# Patient Record
Sex: Male | Born: 1985 | Race: White | Hispanic: No | Marital: Single | Smoking: Never smoker
Health system: Southern US, Community
[De-identification: ages and names within clinical notes are randomized; demographics above are authoritative.]

## PROBLEM LIST (undated history)

## (undated) DIAGNOSIS — M109 Gout, unspecified: Secondary | ICD-10-CM

## (undated) DIAGNOSIS — T7840XA Allergy, unspecified, initial encounter: Secondary | ICD-10-CM

## (undated) DIAGNOSIS — E785 Hyperlipidemia, unspecified: Secondary | ICD-10-CM

## (undated) DIAGNOSIS — Z880 Allergy status to penicillin: Secondary | ICD-10-CM

## (undated) DIAGNOSIS — Z882 Allergy status to sulfonamides status: Secondary | ICD-10-CM

## (undated) HISTORY — DX: Gout, unspecified: M10.9

## (undated) HISTORY — DX: Allergy, unspecified, initial encounter: T78.40XA

## (undated) HISTORY — DX: Hyperlipidemia, unspecified: E78.5

## (undated) HISTORY — PX: COSMETIC SURGERY: SHX468

## (undated) HISTORY — PX: KNEE SURGERY: SHX244

---
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
## 2006-05-24 ENCOUNTER — Emergency Department (HOSPITAL_COMMUNITY): Admission: EM | Admit: 2006-05-24 | Discharge: 2006-05-24 | Payer: Self-pay | Admitting: Emergency Medicine

## 2010-02-09 ENCOUNTER — Emergency Department (HOSPITAL_COMMUNITY): Admission: EM | Admit: 2010-02-09 | Discharge: 2010-02-09 | Payer: Self-pay | Admitting: Family Medicine

## 2011-01-06 LAB — POCT I-STAT, CHEM 8
BUN: 14 mg/dL (ref 6–23)
Calcium, Ion: 1.16 mmol/L (ref 1.12–1.32)
Chloride: 105 meq/L (ref 96–112)
Creatinine, Ser: 0.9 mg/dL (ref 0.4–1.5)
Glucose, Bld: 96 mg/dL (ref 70–99)
HCT: 55 % — ABNORMAL HIGH (ref 39.0–52.0)
Hemoglobin: 18.7 g/dL — ABNORMAL HIGH (ref 13.0–17.0)
Potassium: 4.2 meq/L (ref 3.5–5.1)
Sodium: 139 meq/L (ref 135–145)
TCO2: 25 mmol/L (ref 0–100)

## 2013-12-24 ENCOUNTER — Ambulatory Visit: Payer: 59

## 2013-12-24 ENCOUNTER — Ambulatory Visit (INDEPENDENT_AMBULATORY_CARE_PROVIDER_SITE_OTHER): Payer: 59 | Admitting: Internal Medicine

## 2013-12-24 VITALS — BP 114/66 | HR 103 | Temp 98.5°F | Resp 16 | Ht 68.0 in | Wt 252.0 lb

## 2013-12-24 DIAGNOSIS — M79609 Pain in unspecified limb: Secondary | ICD-10-CM

## 2013-12-24 DIAGNOSIS — M79671 Pain in right foot: Secondary | ICD-10-CM

## 2013-12-24 MED ORDER — MELOXICAM 15 MG PO TABS: 15.0000 mg | ORAL_TABLET | Freq: Every day | ORAL | Status: DC

## 2013-12-24 NOTE — Progress Notes (Addendum)
   Subjective:    Patient ID: Scott Payne, male    DOB: 04/22/1986, 28 y.o.   MRN: [id number] This chart was scribed for Ellamae Siaobert Karlton Maya, MD by Nicholos Johnsenise Iheanachor, Medical Scribe. This patient's care was started at 3:55 PM.  Foot Pain   HPI Comments: Scott Payne is a 28 y.o. male who presents to the [facility] complaining of right foot pain, onset 2 days ago. Says no pain with pressure, pain upon picking the foot up. Pt states he was building a trailer all day 2 days ago and was wearing thick boots. Noticed some mild pain that night but thought nothing of it. Says he woke up the next day and could barely walk on the foot. Pain subsided minimally yesterday but pt says he just wants to make sure everything is okay. Unaware of a hx of gout. Mother states he had an incident years ago with a foot that resulted in similar symptoms as those present today but she cannot remember which foot; does remember pt had to be placed in a boot.  Review of Systems Noncontributory Objective:  Physical Exam  Nursing note and vitals reviewed. Constitutional: He is oriented to person, place, and time. He appears well-developed and well-nourished. No distress.  HENT:  Head: Normocephalic.  Cardiovascular: Normal rate.   Pulmonary/Chest: Effort normal.  Musculoskeletal:  RIGHT FOOT: With redness dorsally but no cellulitis Tender to palpation over the fourth metatarsal and third metatarsal but has good range of motion without pain Ankle is intact  Neurological: He is alert and oriented to person, place, and time.  Skin: Skin is warm and dry.  Psychiatric: He has a normal mood and affect. His behavior is normal.   [facility] reading (PRIMARY) by  Dr. Savi Lastinger=NAD   Assessment & Plan:  I have completed the patient encounter in its entirety as documented by the scribe, with editing by me where necessary. Tracie Dore P. Merla Richesoolittle, M.D.  Pain foot secondary to tendinitis following constriction  of foot by boot Plan-nonweightbearing for 2-3 days Meloxicam Followup if not well

## 2015-08-06 ENCOUNTER — Ambulatory Visit (INDEPENDENT_AMBULATORY_CARE_PROVIDER_SITE_OTHER): Payer: 59 | Admitting: Emergency Medicine

## 2015-08-06 VITALS — BP 120/80 | HR 69 | Temp 98.2°F | Resp 18 | Ht 69.0 in | Wt 251.4 lb

## 2015-08-06 DIAGNOSIS — J0140 Acute pansinusitis, unspecified: Secondary | ICD-10-CM

## 2015-08-06 DIAGNOSIS — J209 Acute bronchitis, unspecified: Secondary | ICD-10-CM

## 2015-08-06 MED ORDER — LEVOFLOXACIN 500 MG PO TABS: 500.0000 mg | ORAL_TABLET | Freq: Every day | ORAL | Status: AC

## 2015-08-06 MED ORDER — PSEUDOEPHEDRINE-GUAIFENESIN ER 60-600 MG PO TB12: 1.0000 | ORAL_TABLET | Freq: Two times a day (BID) | ORAL | Status: DC

## 2015-08-06 MED ORDER — AMOXICILLIN-POT CLAVULANATE 875-125 MG PO TABS
1.0000 | ORAL_TABLET | Freq: Two times a day (BID) | ORAL | Status: DC
Start: 2015-08-06 — End: 2015-10-06

## 2015-08-06 MED ORDER — HYDROCOD POLST-CPM POLST ER 10-8 MG/5ML PO SUER: 5.0000 mL | Freq: Two times a day (BID) | ORAL | Status: DC

## 2015-08-06 NOTE — Patient Instructions (Signed)

## 2015-08-06 NOTE — Progress Notes (Signed)
Subjective:  Patient ID: Scott Payne, male    DOB: 06-30-1986  Age: 29 y.o. MRN: [id number]  CC: Sore Throat and Cough   HPI Scott Payne presents  she has nasal congestion postnasal drainage and sore throat he thinks he may have strep is exposed to child with strep. He has no fever chills no wheezing or shortness of breath he has a cough productive of purulent sputum and a purulent nasal discharge and pressure and pain in his cheeks and for it. Proven with over-the-counter medication  History Scott Payne has no past medical history on file.   He has past surgical history that includes Cosmetic surgery.   His  family history includes Cancer in his father; Diabetes in his mother and sister; Heart disease in his mother; Hyperlipidemia in his mother; Hypertension in his mother.  He   reports that he has never smoked. He does not have any smokeless tobacco history on file. He reports that he does not drink alcohol or use illicit drugs.  Outpatient Prescriptions Prior to Visit  Medication Sig Dispense Refill  . meloxicam (MOBIC) 15 MG tablet Take 1 tablet (15 mg total) by mouth daily. (Patient not taking: Reported on 08/06/2015) 10 tablet 0   No facility-administered medications prior to visit.    Social History   Social History  . Marital Status: Single    Spouse Name: N/A  . Number of Children: N/A  . Years of Education: N/A   Social History Main Topics  . Smoking status: Never Smoker   . Smokeless tobacco: None  . Alcohol Use: No  . Drug Use: No  . Sexual Activity: Not Asked   Other Topics Concern  . None   Social History Narrative     Review of Systems  Constitutional: Positive for fatigue. Negative for fever, chills and appetite change.  HENT: Positive for congestion, postnasal drip, rhinorrhea, sinus pressure and sore throat. Negative for ear pain.   Eyes: Negative for pain and redness.  Respiratory: Positive for cough. Negative for shortness of breath and  wheezing.   Cardiovascular: Negative for leg swelling.  Gastrointestinal: Negative for nausea, vomiting, abdominal pain, diarrhea, constipation and blood in stool.  Endocrine: Negative for polyuria.  Genitourinary: Negative for dysuria, urgency, frequency and flank pain.  Musculoskeletal: Negative for gait problem.  Skin: Negative for rash.  Neurological: Negative for weakness and headaches.  Psychiatric/Behavioral: Negative for confusion and decreased concentration. The patient is not nervous/anxious.     Objective:  BP 120/80 mmHg  Pulse 69  Temp(Src) 98.2 F (36.8 C) (Oral)  Resp 18  Ht  (1.753 m)  Wt 251 lb 6.4 oz (114.034 kg)  BMI 37.11 kg/m2  SpO2 99%  Physical Exam  Constitutional: He is oriented to person, place, and time. He appears well-developed and well-nourished. No distress.  HENT:  Head: Normocephalic and atraumatic.  Right Ear: External ear normal.  Left Ear: External ear normal.  Nose: Nose normal.  Eyes: Conjunctivae and EOM are normal. Pupils are equal, round, and reactive to light. No scleral icterus.  Neck: Normal range of motion. Neck supple. No tracheal deviation present.  Cardiovascular: Normal rate, regular rhythm and normal heart sounds.   Pulmonary/Chest: Effort normal. No respiratory distress. He has no wheezes. He has no rales.  Abdominal: He exhibits no mass. There is no tenderness. There is no rebound and no guarding.  Musculoskeletal: He exhibits no edema.  Lymphadenopathy:    He has no cervical adenopathy.  Neurological:  He is alert and oriented to person, place, and time. Coordination normal.  Skin: Skin is warm and dry. No rash noted.  Psychiatric: He has a normal mood and affect. His behavior is normal.      Assessment & Plan:   Scott Payne was seen today for sore throat and cough.  Diagnoses and all orders for this visit:  Acute pansinusitis, recurrence not specified  Acute bronchitis, unspecified organism  Other orders -      amoxicillin-clavulanate (AUGMENTIN) 875-125 MG tablet; Take 1 tablet by mouth 2 (two) times daily. -     chlorpheniramine-HYDROcodone (TUSSIONEX PENNKINETIC ER) 10-8 MG/5ML SUER; Take 5 mLs by mouth 2 (two) times daily. -     pseudoephedrine-guaifenesin (MUCINEX D) 60-600 MG 12 hr tablet; Take 1 tablet by mouth every 12 (twelve) hours. -     levofloxacin (LEVAQUIN) 500 MG tablet; Take 1 tablet (500 mg total) by mouth daily.  I am having Scott Payne start on amoxicillin-clavulanate, chlorpheniramine-HYDROcodone, pseudoephedrine-guaifenesin, and levofloxacin. I am also having him maintain his meloxicam.  Meds ordered this encounter  Medications  . amoxicillin-clavulanate (AUGMENTIN) 875-125 MG tablet    Sig: Take 1 tablet by mouth 2 (two) times daily.    Dispense:  20 tablet    Refill:  0  . chlorpheniramine-HYDROcodone (TUSSIONEX PENNKINETIC ER) 10-8 MG/5ML SUER    Sig: Take 5 mLs by mouth 2 (two) times daily.    Dispense:  60 mL    Refill:  0  . pseudoephedrine-guaifenesin (MUCINEX D) 60-600 MG 12 hr tablet    Sig: Take 1 tablet by mouth every 12 (twelve) hours.    Dispense:  18 tablet    Refill:  0  . levofloxacin (LEVAQUIN) 500 MG tablet    Sig: Take 1 tablet (500 mg total) by mouth daily.    Dispense:  10 tablet    Refill:  0    Appropriate red flag conditions were discussed with the patient as well as actions that should be taken.  Patient expressed his understanding.  Follow-up: Return if symptoms worsen or fail to improve.  Carmelina DaneAnderson, Audryna Wendt S, MD

## 2015-09-14 ENCOUNTER — Ambulatory Visit (INDEPENDENT_AMBULATORY_CARE_PROVIDER_SITE_OTHER): Payer: 59

## 2015-09-14 ENCOUNTER — Ambulatory Visit (INDEPENDENT_AMBULATORY_CARE_PROVIDER_SITE_OTHER): Payer: 59 | Admitting: Family Medicine

## 2015-09-14 VITALS — BP 112/80 | HR 104 | Temp 98.6°F | Resp 16 | Ht 65.0 in | Wt 251.0 lb

## 2015-09-14 DIAGNOSIS — S93602A Unspecified sprain of left foot, initial encounter: Secondary | ICD-10-CM | POA: Diagnosis not present

## 2015-09-14 DIAGNOSIS — M25572 Pain in left ankle and joints of left foot: Secondary | ICD-10-CM

## 2015-09-14 DIAGNOSIS — L309 Dermatitis, unspecified: Secondary | ICD-10-CM | POA: Diagnosis not present

## 2015-09-14 LAB — URIC ACID: URIC ACID, SERUM: 8.4 mg/dL — AB (ref 4.0–7.8)

## 2015-09-14 MED ORDER — CLOBETASOL PROPIONATE 0.05 % EX CREA: 1.0000 "application " | TOPICAL_CREAM | Freq: Two times a day (BID) | CUTANEOUS | Status: DC

## 2015-09-14 MED ORDER — DICLOFENAC SODIUM 75 MG PO TBEC: 75.0000 mg | DELAYED_RELEASE_TABLET | Freq: Two times a day (BID) | ORAL | Status: DC

## 2015-09-14 NOTE — Progress Notes (Addendum)
Patient ID: Scott SpanielJames A Fano, male    DOB: 04/18/1986  Age: 29 y.o. MRN: [id number]  Chief Complaint  Patient presents with  . 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 He does not know of any specific injury to his left ankle but is been hurting a little bit then this morning was hurting badly. He was unable to stand on it. He pulled out some old crutches and came to the office. He hurts primarily in the front part of the ankle and the dorsum of the foot. It hurts to wear over his toes. More pain on dorsiflexion then and plantar flexion.  Current allergies, medications, problem list, past/family and social histories reviewed.  Objective:  BP 112/80 mmHg  Pulse 104  Temp(Src) 98.6 F (37 C) (Oral)  Resp 16  Ht 5\' 5"  (1.651 m)  Wt 251 lb (113.853 kg)  BMI 41.77 kg/m2  SpO2 98%  no major distress. He has a patch of eczema about 4 cm in diameter on the proximal left shin. The ankle is not acutely swollen. He is not tender around the malleolar or posterior Achilles area. The tenderness is anterior to the medial malleolus and on up to the dorsum of foot down to mid foot. There is pain on dorsiflexion but not plantar flexion. The toe motion is normal but causes some pain. Pulses good.   Assessment & Plan:   Assessment: 1. Pain in joint, ankle and foot, left   2. Eczema   3. Sprain of foot joint, left, initial encounter       Plan: X-ray left ankle. Will try to treat the rash with a stronger cortisone cream.   Meds ordered this encounter  Medications  . clobetasol cream (TEMOVATE) 0.05 %    Sig: Apply 1 application topically 2 (two) times daily.    Dispense:  30 g    Refill:  0  . diclofenac (VOLTAREN) 75 MG EC tablet    Sig: Take 1 tablet (75 mg total) by mouth 2 (two) times daily.    Dispense:  30 tablet    Refill:  0     [facility] reading (PRIMARY)  by  Dr. Alwyn RenHopper Normal ankle and foot  We'll treat symptomatically and keep him out of work.      Patient Instructions  Plan to return Thursday for a recheck  Wear cam walker when up and around. Use crutches. Minimize weightbearing.  Take diclofenac one twice daily  Apply ice to the foot and ankle area for about 10 or 15 minutes for 5 times daily  We will let you know the results of your laboratory testing.  Use the cream on the rash twice daily     Return in about 5 days (around 09/19/2015).   HOPPER,DAVID, MD 09/14/2015

## 2015-09-14 NOTE — Patient Instructions (Addendum)
Plan to return Thursday for a recheck  Wear cam walker when up and around. Use crutches. Minimize weightbearing.  Take diclofenac one twice daily  Apply ice to the foot and ankle area for about 10 or 15 minutes for 5 times daily  We will let you know the results of your laboratory testing.  Use the cream on the rash twice daily

## 2015-09-16 ENCOUNTER — Telehealth: Payer: Self-pay

## 2015-09-16 NOTE — Telephone Encounter (Signed)
LMOM to CB about labs-he has not been notified

## 2015-09-19 ENCOUNTER — Ambulatory Visit (INDEPENDENT_AMBULATORY_CARE_PROVIDER_SITE_OTHER): Payer: 59 | Admitting: Family Medicine

## 2015-09-19 VITALS — BP 128/70 | HR 70 | Temp 98.3°F | Resp 18 | Ht 69.0 in | Wt 253.0 lb

## 2015-09-19 DIAGNOSIS — M109 Gout, unspecified: Secondary | ICD-10-CM | POA: Insufficient documentation

## 2015-09-19 DIAGNOSIS — M10072 Idiopathic gout, left ankle and foot: Secondary | ICD-10-CM

## 2015-09-19 DIAGNOSIS — E790 Hyperuricemia without signs of inflammatory arthritis and tophaceous disease: Secondary | ICD-10-CM | POA: Diagnosis not present

## 2015-09-19 MED ORDER — ALLOPURINOL 100 MG PO TABS
100.0000 mg | ORAL_TABLET | Freq: Every day | ORAL | Status: DC
Start: 2015-09-19 — End: 2018-03-21

## 2015-09-19 NOTE — Patient Instructions (Signed)
Go online and look up foods that cause gout and read about that.  Work hard on trying to learn to eat less and exercise more in order to lose weight  Always drink plenty of fluids  Take allopurinol 100 mg 1 daily for prevention of gout and elevated uric acid  Return in 3 months, which would be late February or early March, to recheck on some labs  Return at anytime if acute flareups.  Gout Gout is an inflammatory arthritis caused by a buildup of uric acid crystals in the joints. Uric acid is a chemical that is normally present in the blood. When the level of uric acid in the blood is too high it can form crystals that deposit in your joints and tissues. This causes joint redness, soreness, and swelling (inflammation). Repeat attacks are common. Over time, uric acid crystals can form into masses (tophi) near a joint, destroying bone and causing disfigurement. Gout is treatable and often preventable. CAUSES  The disease begins with elevated levels of uric acid in the blood. Uric acid is produced by your body when it breaks down a naturally found substance called purines. Certain foods you eat, such as meats and fish, contain high amounts of purines. Causes of an elevated uric acid level include:  Being passed down from parent to child (heredity).  Diseases that cause increased uric acid production (such as obesity, psoriasis, and certain cancers).  Excessive alcohol use.  Diet, especially diets rich in meat and seafood.  Medicines, including certain cancer-fighting medicines (chemotherapy), water pills (diuretics), and aspirin.  Chronic kidney disease. The kidneys are no longer able to remove uric acid well.  Problems with metabolism. Conditions strongly associated with gout include:  Obesity.  High blood pressure.  High cholesterol.  Diabetes. Not everyone with elevated uric acid levels gets gout. It is not understood why some people get gout and others do not. Surgery, joint  injury, and eating too much of certain foods are some of the factors that can lead to gout attacks. SYMPTOMS   An attack of gout comes on quickly. It causes intense pain with redness, swelling, and warmth in a joint.  Fever can occur.  Often, only one joint is involved. Certain joints are more commonly involved:  Base of the big toe.  Knee.  Ankle.  Wrist.  Finger. Without treatment, an attack usually goes away in a few days to weeks. Between attacks, you usually will not have symptoms, which is different from many other forms of arthritis. DIAGNOSIS  Your caregiver will suspect gout based on your symptoms and exam. In some cases, tests may be recommended. The tests may include:  Blood tests.  Urine tests.  X-rays.  Joint fluid exam. This exam requires a needle to remove fluid from the joint (arthrocentesis). Using a microscope, gout is confirmed when uric acid crystals are seen in the joint fluid. TREATMENT  There are two phases to gout treatment: treating the sudden onset (acute) attack and preventing attacks (prophylaxis).  Treatment of an Acute Attack.  Medicines are used. These include anti-inflammatory medicines or steroid medicines.  An injection of steroid medicine into the affected joint is sometimes necessary.  The painful joint is rested. Movement can worsen the arthritis.  You may use warm or cold treatments on painful joints, depending which works best for you.  Treatment to Prevent Attacks.  If you suffer from frequent gout attacks, your caregiver may advise preventive medicine. These medicines are started after the acute attack subsides.  These medicines either help your kidneys eliminate uric acid from your body or decrease your uric acid production. You may need to stay on these medicines for a very long time.  The early phase of treatment with preventive medicine can be associated with an increase in acute gout attacks. For this reason, during the first  few months of treatment, your caregiver may also advise you to take medicines usually used for acute gout treatment. Be sure you understand your caregiver's directions. Your caregiver may make several adjustments to your medicine dose before these medicines are effective.  Discuss dietary treatment with your caregiver or dietitian. Alcohol and drinks high in sugar and fructose and foods such as meat, poultry, and seafood can increase uric acid levels. Your caregiver or dietitian can advise you on drinks and foods that should be limited. HOME CARE INSTRUCTIONS   Do not take aspirin to relieve pain. This raises uric acid levels.  Only take over-the-counter or prescription medicines for pain, discomfort, or fever as directed by your caregiver.  Rest the joint as much as possible. When in bed, keep sheets and blankets off painful areas.  Keep the affected joint raised (elevated).  Apply warm or cold treatments to painful joints. Use of warm or cold treatments depends on which works best for you.  Use crutches if the painful joint is in your leg.  Drink enough fluids to keep your urine clear or pale yellow. This helps your body get rid of uric acid. Limit alcohol, sugary drinks, and fructose drinks.  Follow your dietary instructions. Pay careful attention to the amount of protein you eat. Your daily diet should emphasize fruits, vegetables, whole grains, and fat-free or low-fat milk products. Discuss the use of coffee, vitamin C, and cherries with your caregiver or dietitian. These may be helpful in lowering uric acid levels.  Maintain a healthy body weight. SEEK MEDICAL CARE IF:   You develop diarrhea, vomiting, or any side effects from medicines.  You do not feel better in 24 hours, or you are getting worse. SEEK IMMEDIATE MEDICAL CARE IF:   Your joint becomes suddenly more tender, and you have chills or a fever. MAKE SURE YOU:   Understand these instructions.  Will watch your  condition.  Will get help right away if you are not doing well or get worse.   This information is not intended to replace advice given to you by your health care provider. Make sure you discuss any questions you have with your health care provider.   Document Released: 10/02/2000 Document Revised: 10/26/2014 Document Reviewed: 05/18/2012 [employer] Interactive Patient Education [employer]2016 [employer]

## 2015-09-19 NOTE — Progress Notes (Addendum)
Patient ID: Scott Payne, male    DOB: 10/19/1985  Age: 29 y.o. MRN: [id number]  Chief Complaint  Patient presents with  . Follow-up    left foot     Subjective:   Patient is here for a follow-up with regard to his left ankle and foot pain. It's doing much better. He is wearing his regular sneakers today and working well.  Current allergies, medications, problem list, past/family and social histories reviewed.  Objective:  BP 128/70 mmHg  Pulse 70  Temp(Src) 98.3 F (36.8 C) (Oral)  Resp 18  Ht 5\' 9"  (1.753 m)  Wt 253 lb (114.76 kg)  BMI 37.34 kg/m2  SpO2 98%  Labs last week had an elevated uric acid of 8.4 Ankle does not seem tender. Good range of motion. Reviewed the x-ray studies from last week  Assessment & Plan:   Assessment: 1. Acute gout of left ankle, unspecified cause   2. Hyperuricemia       Plan: Will treat for hyperuricemia and gout. Discussed this with him. See him back in about 3 months.   Meds ordered this encounter  Medications  . allopurinol (ZYLOPRIM) 100 MG tablet    Sig: Take 1 tablet (100 mg total) by mouth daily.    Dispense:  30 tablet    Refill:  6         Patient Instructions  Go online and look up foods that cause gout and read about that.  Work hard on trying to learn to eat less and exercise more in order to lose weight  Always drink plenty of fluids  Take allopurinol 100 mg 1 daily for prevention of gout and elevated uric acid  Return in 3 months, which would be late February or early March, to recheck on some labs  Return at anytime if acute flareups.  Gout Gout is an inflammatory arthritis caused by a buildup of uric acid crystals in the joints. Uric acid is a chemical that is normally present in the blood. When the level of uric acid in the blood is too high it can form crystals that deposit in your joints and tissues. This causes joint redness, soreness, and swelling (inflammation). Repeat attacks are common. Over  time, uric acid crystals can form into masses (tophi) near a joint, destroying bone and causing disfigurement. Gout is treatable and often preventable. CAUSES  The disease begins with elevated levels of uric acid in the blood. Uric acid is produced by your body when it breaks down a naturally found substance called purines. Certain foods you eat, such as meats and fish, contain high amounts of purines. Causes of an elevated uric acid level include:  Being passed down from parent to child (heredity).  Diseases that cause increased uric acid production (such as obesity, psoriasis, and certain cancers).  Excessive alcohol use.  Diet, especially diets rich in meat and seafood.  Medicines, including certain cancer-fighting medicines (chemotherapy), water pills (diuretics), and aspirin.  Chronic kidney disease. The kidneys are no longer able to remove uric acid well.  Problems with metabolism. Conditions strongly associated with gout include:  Obesity.  High blood pressure.  High cholesterol.  Diabetes. Not everyone with elevated uric acid levels gets gout. It is not understood why some people get gout and others do not. Surgery, joint injury, and eating too much of certain foods are some of the factors that can lead to gout attacks. SYMPTOMS   An attack of gout comes on quickly. It causes intense  pain with redness, swelling, and warmth in a joint.  Fever can occur.  Often, only one joint is involved. Certain joints are more commonly involved:  Base of the big toe.  Knee.  Ankle.  Wrist.  Finger. Without treatment, an attack usually goes away in a few days to weeks. Between attacks, you usually will not have symptoms, which is different from many other forms of arthritis. DIAGNOSIS  Your caregiver will suspect gout based on your symptoms and exam. In some cases, tests may be recommended. The tests may include:  Blood tests.  Urine tests.  X-rays.  Joint fluid exam.  This exam requires a needle to remove fluid from the joint (arthrocentesis). Using a microscope, gout is confirmed when uric acid crystals are seen in the joint fluid. TREATMENT  There are two phases to gout treatment: treating the sudden onset (acute) attack and preventing attacks (prophylaxis).  Treatment of an Acute Attack.  Medicines are used. These include anti-inflammatory medicines or steroid medicines.  An injection of steroid medicine into the affected joint is sometimes necessary.  The painful joint is rested. Movement can worsen the arthritis.  You may use warm or cold treatments on painful joints, depending which works best for you.  Treatment to Prevent Attacks.  If you suffer from frequent gout attacks, your caregiver may advise preventive medicine. These medicines are started after the acute attack subsides. These medicines either help your kidneys eliminate uric acid from your body or decrease your uric acid production. You may need to stay on these medicines for a very long time.  The early phase of treatment with preventive medicine can be associated with an increase in acute gout attacks. For this reason, during the first few months of treatment, your caregiver may also advise you to take medicines usually used for acute gout treatment. Be sure you understand your caregiver's directions. Your caregiver may make several adjustments to your medicine dose before these medicines are effective.  Discuss dietary treatment with your caregiver or dietitian. Alcohol and drinks high in sugar and fructose and foods such as meat, poultry, and seafood can increase uric acid levels. Your caregiver or dietitian can advise you on drinks and foods that should be limited. HOME CARE INSTRUCTIONS   Do not take aspirin to relieve pain. This raises uric acid levels.  Only take over-the-counter or prescription medicines for pain, discomfort, or fever as directed by your caregiver.  Rest the  joint as much as possible. When in bed, keep sheets and blankets off painful areas.  Keep the affected joint raised (elevated).  Apply warm or cold treatments to painful joints. Use of warm or cold treatments depends on which works best for you.  Use crutches if the painful joint is in your leg.  Drink enough fluids to keep your urine clear or pale yellow. This helps your body get rid of uric acid. Limit alcohol, sugary drinks, and fructose drinks.  Follow your dietary instructions. Pay careful attention to the amount of protein you eat. Your daily diet should emphasize fruits, vegetables, whole grains, and fat-free or low-fat milk products. Discuss the use of coffee, vitamin C, and cherries with your caregiver or dietitian. These may be helpful in lowering uric acid levels.  Maintain a healthy body weight. SEEK MEDICAL CARE IF:   You develop diarrhea, vomiting, or any side effects from medicines.  You do not feel better in 24 hours, or you are getting worse. SEEK IMMEDIATE MEDICAL CARE IF:   Your  joint becomes suddenly more tender, and you have chills or a fever. MAKE SURE YOU:   Understand these instructions.  Will watch your condition.  Will get help right away if you are not doing well or get worse.   This information is not intended to replace advice given to you by your health care provider. Make sure you discuss any questions you have with your health care provider.   Document Released: 10/02/2000 Document Revised: 10/26/2014 Document Reviewed: 05/18/2012 Elsevier Interactive Patient Education [employer]          Return in about 3 months (around 12/18/2015).   Stanly Si, MD 09/19/2015

## 2015-09-24 NOTE — Telephone Encounter (Signed)
Looks like Denny Peonrin was able to reach that Pt.

## 2015-10-06 ENCOUNTER — Ambulatory Visit (INDEPENDENT_AMBULATORY_CARE_PROVIDER_SITE_OTHER): Payer: 59 | Admitting: Family Medicine

## 2015-10-06 VITALS — BP 121/80 | HR 71 | Temp 98.0°F | Resp 20 | Ht 69.0 in | Wt 256.4 lb

## 2015-10-06 DIAGNOSIS — Z13220 Encounter for screening for lipoid disorders: Secondary | ICD-10-CM

## 2015-10-06 DIAGNOSIS — Z114 Encounter for screening for human immunodeficiency virus [HIV]: Secondary | ICD-10-CM | POA: Diagnosis not present

## 2015-10-06 DIAGNOSIS — M1009 Idiopathic gout, multiple sites: Secondary | ICD-10-CM

## 2015-10-06 DIAGNOSIS — M109 Gout, unspecified: Secondary | ICD-10-CM

## 2015-10-06 DIAGNOSIS — Z0000 Encounter for general adult medical examination without abnormal findings: Secondary | ICD-10-CM | POA: Diagnosis not present

## 2015-10-06 DIAGNOSIS — Z131 Encounter for screening for diabetes mellitus: Secondary | ICD-10-CM | POA: Diagnosis not present

## 2015-10-06 DIAGNOSIS — Z23 Encounter for immunization: Secondary | ICD-10-CM | POA: Diagnosis not present

## 2015-10-06 DIAGNOSIS — E785 Hyperlipidemia, unspecified: Secondary | ICD-10-CM

## 2015-10-06 LAB — COMPLETE METABOLIC PANEL WITH GFR
ALT: 29 U/L (ref 9–46)
AST: 20 U/L (ref 10–40)
Albumin: 4.2 g/dL (ref 3.6–5.1)
Alkaline Phosphatase: 59 U/L (ref 40–115)
BUN: 13 mg/dL (ref 7–25)
CHLORIDE: 101 mmol/L (ref 98–110)
CO2: 28 mmol/L (ref 20–31)
Calcium: 9.3 mg/dL (ref 8.6–10.3)
Creat: 0.92 mg/dL (ref 0.60–1.35)
Glucose, Bld: 78 mg/dL (ref 65–99)
POTASSIUM: 4.4 mmol/L (ref 3.5–5.3)
Sodium: 137 mmol/L (ref 135–146)
Total Bilirubin: 0.5 mg/dL (ref 0.2–1.2)
Total Protein: 7.1 g/dL (ref 6.1–8.1)

## 2015-10-06 LAB — LIPID PANEL
Cholesterol: 230 mg/dL — ABNORMAL HIGH (ref 125–200)
HDL: 28 mg/dL — AB (ref >=40)
TRIGLYCERIDES: 529 mg/dL — AB (ref <150)
Total CHOL/HDL Ratio: 8.2 Ratio — ABNORMAL HIGH (ref <=5.0)

## 2015-10-06 LAB — HIV ANTIBODY (ROUTINE TESTING W REFLEX): HIV 1&2 Ab, 4th Generation: NONREACTIVE

## 2015-10-06 NOTE — Progress Notes (Signed)
Subjective:  By signing my name below, I, Raven Small, attest that this documentation has been prepared under the direction and in the presence of Meredith StaggersJeffrey Sonna Lipsky, MD.  Electronically Signed: Andrew Auaven Small, ED Scribe. 10/06/2015. 1:36 PM.   Patient ID: Scott Payne, male    DOB: 05/05/1986, 29 y.o.   MRN: [id number]  HPI   Chief Complaint  Patient presents with  . Annual Exam    HPI Comments: Scott Payne is a 29 y.o. male who presents to the [facility] for a physical.  Cancer screening Family hx of prostate cancer in his father, diagnosed in 6560's. He denies trouble urinating, blood in urine and nocturia.   Family hx Family hx of diabetes in his mother and sister. Mother also with hx hyperlipidemia.    STI/HIV testing Pt is single. He is sexually active and uses protection with intercourse every time. He denies hx of STI and declines testing today. He agrees to HIV testing today.   Immunizations There is no immunization history on file for this patient. Last TDAP has been within 10 years, but date is unknown. He has not had flu shot and agrees to this today.   Depression screening  Depression screen [facility] 2/9 09/19/2015 09/14/2015 08/06/2015  Decreased Interest 0 0 0  Down, Depressed, Hopeless 0 0 0  PHQ - 2 Score 0 0 0   Pt denies depression.   Eye care  Visual Acuity Screening   Right eye Left eye Both eyes  Without correction: 20/20 20/20 20/15   With correction:      Pt does not wear glasses or contacts. He has hx of lasix eye surgery, 3 years ago. He has not been seen by optho recently.   Dentist  Seen by dentist last year.  Exercise Pt does not exercise regularly but states his job requires physical work.  States his largest meal is at night around 7pm after work. He does not drink sodas regularly but does drink sweet teas. .   Gout  Last uric acid was 8.4. Most recently seen 12/1 by Dr. Alwyn RenHopper and was started on allopurinol. Previously  treated with diclofenac with improvement. Pt has been doing well with allopurinol without any flares.    Patient Active Problem List   Diagnosis Date Noted  . Acute gout 09/19/2015  . Hyperuricemia 09/19/2015   History reviewed. No pertinent past medical history. Past Surgical History  Procedure Laterality Date  . Cosmetic surgery     Allergies  Allergen Reactions  . Penicillins Itching and Rash   Prior to Admission medications   Medication Sig Start Date End Date Taking? Authorizing Provider  allopurinol (ZYLOPRIM) 100 MG tablet Take 1 tablet (100 mg total) by mouth daily. 09/19/15  Yes Peyton Najjaravid H Hopper, MD  clobetasol cream (TEMOVATE) 0.05 % Apply 1 application topically 2 (two) times daily. 09/14/15  Yes Peyton Najjaravid H Hopper, MD  amoxicillin-clavulanate (AUGMENTIN) 875-125 MG tablet Take 1 tablet by mouth 2 (two) times daily. Patient not taking: Reported on 09/14/2015 08/06/15   Carmelina DaneJeffery S Anderson, MD  chlorpheniramine-HYDROcodone [facility](TUSSIONEX PENNKINETIC ER) 10-8 MG/5ML SUER Take 5 mLs by mouth 2 (two) times daily. Patient not taking: Reported on 09/14/2015 08/06/15   Carmelina DaneJeffery S Anderson, MD  diclofenac (VOLTAREN) 75 MG EC tablet Take 1 tablet (75 mg total) by mouth 2 (two) times daily. Patient not taking: Reported on 10/06/2015 09/14/15   Peyton Najjaravid H Hopper, MD  meloxicam (MOBIC) 15 MG tablet Take 1 tablet (15  mg total) by mouth daily. Patient not taking: Reported on 08/06/2015 12/24/13   Tonye Pearson, MD  pseudoephedrine-guaifenesin [facility]) 60-600 MG 12 hr tablet Take 1 tablet by mouth every 12 (twelve) hours. Patient not taking: Reported on 09/14/2015 08/06/15 08/05/16  Carmelina Dane, MD   Social History   Social History  . Marital Status: Single    Spouse Name: N/A  . Number of Children: N/A  . Years of Education: N/A   Occupational History  . Not on file.   Social History Main Topics  . Smoking status: Never Smoker   . Smokeless tobacco: Not on file  . Alcohol Use:  No  . Drug Use: No  . Sexual Activity: Not on file   Other Topics Concern  . Not on file   Social History Narrative   Review of Systems 13 point ROS reviewed. No positives listed.    Objective:   Physical Exam  Constitutional: He is oriented to person, place, and time. He appears well-developed and well-nourished. No distress.  HENT:  Head: Normocephalic and atraumatic.  Eyes: Conjunctivae and EOM are normal.  Neck: Neck supple.  Cardiovascular: Normal rate.   Pulmonary/Chest: Effort normal.  Musculoskeletal: Normal range of motion.  Neurological: He is alert and oriented to person, place, and time.  Skin: Skin is warm and dry.  Psychiatric: He has a normal mood and affect. His behavior is normal.  Nursing note and vitals reviewed.  Filed Vitals:   10/06/15 1237  BP: 121/80  Pulse: 71  Temp: 98 F (36.7 C)  TempSrc: Oral  Resp: 20  Height:  (1.753 m)  Weight: 256 lb 6.4 oz (116.302 kg)  SpO2: 98%  Body mass index is 37.85 kg/(m^2).  Assessment & Plan:  Scott Payne is a 29 y.o. male Annual physical exam  --anticipatory guidance as below in AVS, screening labs above. Health maintenance items as above in HPI discussed/recommended as applicable.   -increased exercise, decrease in sweet tea and decrease portions at dinner to help with weight loss.   -form signed for his work.   Screening for diabetes mellitus - Plan: COMPLETE METABOLIC PANEL WITH GFR  Screening for hyperlipidemia - Plan: Lipid panel  Gouty arthritis  -improved. Plan on uric acid level in next 3 months.   Need for prophylactic vaccination and inoculation against influenza - Plan: Flu Vaccine QUAD 36+ mos IM given.   Screening for HIV (human immunodeficiency virus) - Plan: HIV antibody   No orders of the defined types were placed in this encounter.   Patient Instructions  You should receive a call or letter about your lab results within the next week to 10 days.   Continue allopurinol  daily and recheck with repeat uric acid in 3 months.   Return to the clinic or go to the nearest emergency room if any of your symptoms worsen or new symptoms occur.  Keeping you healthy  Get these tests  Blood pressure- Have your blood pressure checked once a year by your healthcare provider.  Normal blood pressure is 120/80.  Weight- Have your body mass index (BMI) calculated to screen for obesity.  BMI is a measure of body fat based on height and weight. You can also calculate your own BMI at https://www.west-esparza.com/.  Cholesterol- Have your cholesterol checked regularly starting at age 15, sooner may be necessary if you have diabetes, high blood pressure, if a family member developed heart diseases at an early age or if you  smoke.   Chlamydia, HIV, and other sexual transmitted disease- Get screened each year until the age of 37 then within three months of each new sexual partner.  Diabetes- Have your blood sugar checked regularly if you have high blood pressure, high cholesterol, a family history of diabetes or if you are overweight.  Get these vaccines  Flu shot- Every fall.  Tetanus shot- Every 10 years.  Menactra- Single dose; prevents meningitis.  Take these steps  Don't smoke- If you do smoke, ask your healthcare provider about quitting. For tips on how to quit, go to www.smokefree.gov or call 1-800-QUIT-NOW.  Be physically active- Exercise 5 days a week for at least 30 minutes.  If you are not already physically active start slow and gradually work up to 30 minutes of moderate physical activity.  Examples of moderate activity include walking briskly, mowing the yard, dancing, swimming bicycling, etc.  Eat a healthy diet- Eat a variety of healthy foods such as fruits, vegetables, low fat milk, low fat cheese, yogurt, lean meats, poultry, fish, beans, tofu, etc.  For more information on healthy eating, go to www.thenutritionsource.org  Drink alcohol in moderation- Limit  alcohol intake two drinks or less a day.  Never drink and drive.  Dentist- Brush and floss teeth twice daily; visit your dentis twice a year.  Depression-Your emotional health is as important as your physical health.  If you're feeling down, losing interest in things you normally enjoy please talk with your healthcare provider.  Gun Safety- If you keep a gun in your home, keep it unloaded and with the safety lock on.  Bullets should be stored separately.  Helmet use- Always wear a helmet when riding a motorcycle, bicycle, rollerblading or skateboarding.  Safe sex- If you may be exposed to a sexually transmitted infection, use a condom  Seat belts- Seat bels can save your life; always wear one.  Smoke/Carbon Monoxide detectors- These detectors need to be installed on the appropriate level of your home.  Replace batteries at least once a year.  Skin Cancer- When out in the sun, cover up and use sunscreen SPF 15 or higher.  Violence- If anyone is threatening or hurting you, please tell your healthcare provider.    I personally performed the services described in this documentation, which was scribed in my presence. The recorded information has been reviewed and considered, and addended by me as needed.

## 2015-10-06 NOTE — Patient Instructions (Signed)
You should receive a call or letter about your lab results within the next week to 10 days.   Continue allopurinol daily and recheck with repeat uric acid in 3 months.   Return to the clinic or go to the nearest emergency room if any of your symptoms worsen or new symptoms occur.  Keeping you healthy  Get these tests  Blood pressure- Have your blood pressure checked once a year by your healthcare provider.  Normal blood pressure is 120/80.  Weight- Have your body mass index (BMI) calculated to screen for obesity.  BMI is a measure of body fat based on height and weight. You can also calculate your own BMI at https://www.west-esparza.com/www.nhlbisupport.com/bmi/.  Cholesterol- Have your cholesterol checked regularly starting at age 30, sooner may be necessary if you have diabetes, high blood pressure, if a family member developed heart diseases at an early age or if you smoke.   Chlamydia, HIV, and other sexual transmitted disease- Get screened each year until the age of 30 then within three months of each new sexual partner.  Diabetes- Have your blood sugar checked regularly if you have high blood pressure, high cholesterol, a family history of diabetes or if you are overweight.  Get these vaccines  Flu shot- Every fall.  Tetanus shot- Every 10 years.  Menactra- Single dose; prevents meningitis.  Take these steps  Don't smoke- If you do smoke, ask your healthcare provider about quitting. For tips on how to quit, go to www.smokefree.gov or call 1-800-QUIT-NOW.  Be physically active- Exercise 5 days a week for at least 30 minutes.  If you are not already physically active start slow and gradually work up to 30 minutes of moderate physical activity.  Examples of moderate activity include walking briskly, mowing the yard, dancing, swimming bicycling, etc.  Eat a healthy diet- Eat a variety of healthy foods such as fruits, vegetables, low fat milk, low fat cheese, yogurt, lean meats, poultry, fish, beans, tofu,  etc.  For more information on healthy eating, go to www.thenutritionsource.org  Drink alcohol in moderation- Limit alcohol intake two drinks or less a day.  Never drink and drive.  Dentist- Brush and floss teeth twice daily; visit your dentis twice a year.  Depression-Your emotional health is as important as your physical health.  If you're feeling down, losing interest in things you normally enjoy please talk with your healthcare provider.  Gun Safety- If you keep a gun in your home, keep it unloaded and with the safety lock on.  Bullets should be stored separately.  Helmet use- Always wear a helmet when riding a motorcycle, bicycle, rollerblading or skateboarding.  Safe sex- If you may be exposed to a sexually transmitted infection, use a condom  Seat belts- Seat bels can save your life; always wear one.  Smoke/Carbon Monoxide detectors- These detectors need to be installed on the appropriate level of your home.  Replace batteries at least once a year.  Skin Cancer- When out in the sun, cover up and use sunscreen SPF 15 or higher.  Violence- If anyone is threatening or hurting you, please tell your healthcare provider.

## 2015-10-23 NOTE — Addendum Note (Signed)
Addended by: Meredith StaggersGREENE, Abrar Bilton R on: 10/23/2015 09:18 PM   Modules accepted: Orders

## 2016-01-13 ENCOUNTER — Ambulatory Visit (INDEPENDENT_AMBULATORY_CARE_PROVIDER_SITE_OTHER): Payer: 59 | Admitting: Emergency Medicine

## 2016-01-13 VITALS — BP 126/80 | HR 90 | Temp 98.1°F | Resp 20 | Ht 68.5 in | Wt 258.4 lb

## 2016-01-13 DIAGNOSIS — R05 Cough: Secondary | ICD-10-CM

## 2016-01-13 DIAGNOSIS — R059 Cough, unspecified: Secondary | ICD-10-CM

## 2016-01-13 DIAGNOSIS — J029 Acute pharyngitis, unspecified: Secondary | ICD-10-CM

## 2016-01-13 DIAGNOSIS — J209 Acute bronchitis, unspecified: Secondary | ICD-10-CM

## 2016-01-13 LAB — POCT INFLUENZA A/B
INFLUENZA A, POC: NEGATIVE
INFLUENZA B, POC: NEGATIVE

## 2016-01-13 LAB — POCT RAPID STREP A (OFFICE): Rapid Strep A Screen: NEGATIVE

## 2016-01-13 MED ORDER — FIRST-DUKES MOUTHWASH MT SUSP: OROMUCOSAL | Status: DC

## 2016-01-13 MED ORDER — BENZONATATE 100 MG PO CAPS: 100.0000 mg | ORAL_CAPSULE | Freq: Three times a day (TID) | ORAL | Status: DC | PRN

## 2016-01-13 MED ORDER — AZITHROMYCIN 250 MG PO TABS: ORAL_TABLET | ORAL | Status: DC

## 2016-01-13 NOTE — Patient Instructions (Addendum)
     IF you received an x-ray today, you will receive an invoice from [facility]. Please contact [facility] at [phone] with questions or concerns regarding your invoice.   IF you received labwork today, you will receive an invoice from [facility]. Please contact [facility] at [phone] with questions or concerns regarding your invoice.   Our billing staff will not be able to assist you with questions regarding bills from these companies.  You will be contacted with the lab results as soon as they are available. The fastest way to get your results is to activate your My Chart account. Instructions are located on the last page of this paperwork. If you have not heard from us regarding the results in 2 weeks, please contact this office.     Sore Throat A sore throat is pain, burning, irritation, or scratchiness of the throat. There is often pain or tenderness when swallowing or talking. A sore throat may be accompanied by other symptoms, such as coughing, sneezing, fever, and swollen neck glands. A sore throat is often the first sign of another sickness, such as a cold, flu, strep throat, or mononucleosis (commonly known as mono). Most sore throats go away without medical treatment. CAUSES  The most common causes of a sore throat include:  A viral infection, such as a cold, flu, or mono.  A bacterial infection, such as strep throat, tonsillitis, or whooping cough.  Seasonal allergies.  Dryness in the air.  Irritants, such as smoke or pollution.  Gastroesophageal reflux disease (GERD). HOME CARE INSTRUCTIONS   Only take over-the-counter medicines as directed by your caregiver.  Drink enough fluids to keep your urine clear or pale yellow.  Rest as needed.  Try using throat sprays, lozenges, or sucking on hard candy to ease any pain (if older than 4 years or as directed).  Sip warm liquids, such as broth, herbal tea, or warm water  with honey to relieve pain temporarily. You may also eat or drink cold or frozen liquids such as frozen ice pops.  Gargle with salt water (mix 1 tsp salt with 8 oz of water).  Do not smoke and avoid secondhand smoke.  Put a cool-mist humidifier in your bedroom at night to moisten the air. You can also turn on a hot shower and sit in the bathroom with the door closed for 5-10 minutes. SEEK IMMEDIATE MEDICAL CARE IF:  You have difficulty breathing.  You are unable to swallow fluids, soft foods, or your saliva.  You have increased swelling in the throat.  Your sore throat does not get better in 7 days.  You have nausea and vomiting.  You have a fever or persistent symptoms for more than 2-3 days.  You have a fever and your symptoms suddenly get worse. MAKE SURE YOU:   Understand these instructions.  Will watch your condition.  Will get help right away if you are not doing well or get worse.   This information is not intended to replace advice given to you by your health care provider. Make sure you discuss any questions you have with your health care provider.   Document Released: 11/12/2004 Document Revised: 10/26/2014 Document Reviewed: 06/12/2012 Elsevier Interactive Patient Education 2016 [employer]  

## 2016-01-13 NOTE — Progress Notes (Signed)
Patient ID: Scott Payne, male   DOB: 07/16/1986, 30 y.o.   MRN: [id number]     By signing my name below, I, Scott Payne, attest that this documentation has been prepared under the direction and in the presence of Lesle ChrisSteven Adric Wrede, MD.  Electronically Signed: Littie Deedsichard Payne, Medical Scribe. 01/13/2016. 8:39 AM.   Chief Complaint:  Chief Complaint  Patient presents with  . Sore Throat    x 2 days  . Cough    HPI: Scott Payne is a 30 y.o. male who [facility] complaining of gradual onset sore throat that started yesterday, but worsened this morning. He reports having associated productive cough. Patient denies fever, myalgias, headache, back pain, and adenopathy. He also denies smoking. He thinks one of his nieces may have had strep throat at some point, but he does not remember when. No known sick contacts with flu or strep throat otherwise.  Patient works with sheet metal.  No past medical history on file. Past Surgical History  Procedure Laterality Date  . Cosmetic surgery     Social History   Social History  . Marital Status: Single    Spouse Name: N/A  . Number of Children: N/A  . Years of Education: N/A   Social History Main Topics  . Smoking status: Never Smoker   . Smokeless tobacco: None  . Alcohol Use: No  . Drug Use: No  . Sexual Activity: Not Asked   Other Topics Concern  . None   Social History Narrative   Family History  Problem Relation Age of Onset  . Diabetes Mother   . Heart disease Mother   . Hyperlipidemia Mother   . Hypertension Mother   . Cancer Father   . Diabetes Sister    Allergies  Allergen Reactions  . Penicillins Itching and Rash   Prior to Admission medications   Medication Sig Start Date End Date Taking? Authorizing Provider  clobetasol cream (TEMOVATE) 0.05 % Apply 1 application topically 2 (two) times daily. 09/14/15  Yes Peyton Najjaravid H Hopper, MD  allopurinol (ZYLOPRIM) 100 MG tablet Take 1 tablet (100 mg total) by mouth  daily. Patient not taking: Reported on 01/13/2016 09/19/15   Peyton Najjaravid H Hopper, MD     ROS: The patient denies fevers, chills, night sweats, unintentional weight loss, chest pain, palpitations, wheezing, dyspnea on exertion, nausea, vomiting, abdominal pain, dysuria, hematuria, melena, numbness, weakness, or tingling.  All other systems have been reviewed and were otherwise negative with the exception of those mentioned in the HPI and as above.    PHYSICAL EXAM: Filed Vitals:   01/13/16 0830  BP: 126/80  Pulse: 90  Temp: 98.1 F (36.7 C)  Resp: 20   Body mass index is 38.71 kg/(m^2).   General: Alert, no acute distress. Ill but not toxic. HEENT:  Normocephalic, atraumatic, oropharynx patent. Ears normal. Nasal congestion. Thick, yellow posterior drainage with redness at the posterior pharynx. [facility] Cardiovascular:  Regular rate and rhythm, no rubs murmurs or gallops.  No Carotid bruits, radial pulse intact. No pedal edema.  Respiratory: Clear to auscultation bilaterally.  No wheezes, rales, or rhonchi.  No cyanosis, no use of accessory musculature Abdominal: No organomegaly, abdomen is soft and non-tender, positive bowel sounds.  No masses. Musculoskeletal: Gait intact. No edema, tenderness Skin: No rashes. Neurologic: Facial musculature symmetric. Psychiatric: Patient acts appropriately throughout our interaction. Lymphatic: Small anterior cervical nodes.   LABS: Results for orders placed or performed in visit on 01/13/16  POCT rapid strep A  Result Value Ref Range   Rapid Strep A Screen Negative Negative  POCT Influenza A/B  Result Value Ref Range   Influenza A, POC Negative Negative   Influenza B, POC Negative Negative     EKG/XRAY:   Primary read interpreted by Dr. Cleta Alberts at [facility].   ASSESSMENT/PLAN: I did place patient on a Z-Pak. When I looked in his throat it was red and there is a thick yellow postnasal drip. His strep test was negative and strep  culture was sent. He was given a note to be out of work until Wednesday. He was also given Dukes mouthwash to help with his sore throat discomfort. He was also starting to cough significantly and was given some Tessalon Perles.I personally performed the services described in this documentation, which was scribed in my presence. The recorded information has been reviewed and is accurate.   Gross sideeffects, risk and benefits, and alternatives of medications d/w patient. Patient is aware that all medications have potential sideeffects and we are unable to predict every sideeffect or drug-drug interaction that may occur.  Lesle Chris MD 01/13/2016 8:39 AM

## 2016-01-17 LAB — CULTURE, GROUP A STREP

## 2016-05-12 ENCOUNTER — Ambulatory Visit (INDEPENDENT_AMBULATORY_CARE_PROVIDER_SITE_OTHER): Payer: 59

## 2016-05-12 ENCOUNTER — Ambulatory Visit (INDEPENDENT_AMBULATORY_CARE_PROVIDER_SITE_OTHER): Payer: 59 | Admitting: Physician Assistant

## 2016-05-12 VITALS — BP 118/80 | HR 70 | Temp 98.0°F | Resp 18 | Ht 68.5 in | Wt 255.0 lb

## 2016-05-12 DIAGNOSIS — M25571 Pain in right ankle and joints of right foot: Secondary | ICD-10-CM

## 2016-05-12 DIAGNOSIS — M109 Gout, unspecified: Secondary | ICD-10-CM

## 2016-05-12 DIAGNOSIS — M1009 Idiopathic gout, multiple sites: Secondary | ICD-10-CM | POA: Diagnosis not present

## 2016-05-12 DIAGNOSIS — R21 Rash and other nonspecific skin eruption: Secondary | ICD-10-CM | POA: Diagnosis not present

## 2016-05-12 NOTE — Patient Instructions (Addendum)
Rest, elevate your ankle, put ice on ankle and foot, wear ace wrap for compression May return to work in 1 week. If your symptoms are not improving in 1 week, return.  For your rash -- mix hydrocortisone with blue star ointment and apply twice a day for 2 weeks -- if not getting better, return for biopsy.  For your gout -- stop taking the allopurinol. For flares, take ibuprofen or aleve or return here if not getting better. You can take allopurinol in the future if you get 3-4 gout flares in a year.    IF you received an x-ray today, you will receive an invoice from [facility]. Please contact [facility] at [phone] with questions or concerns regarding your invoice.   IF you received labwork today, you will receive an invoice from [employer]. Please contact [facility] at [phone] with questions or concerns regarding your invoice.   Our billing staff will not be able to assist you with questions regarding bills from these companies.  You will be contacted with the lab results as soon as they are available. The fastest way to get your results is to activate your My Chart account. Instructions are located on the last page of this paperwork. If you have not heard from [place] regarding the results in 2 weeks, please contact this office.

## 2016-05-12 NOTE — Progress Notes (Signed)
[facility] [address], [place] [address] [phone]  Date:  05/12/2016   Name:  Scott Payne   DOB:  08-09-1986   MRN:  [id number]  PCP:  No PCP Per Patient    Chief Complaint: Ankle Pain (Since Friday, NKI. Gout medication not helping)   History of Present Illness:  This is a 30 y.o. male with PMH gout who is presenting with right ankle and foot pain x 4 days. States he was diagnosed with gout 8 months ago. Has had one flare since. Both episodes have been in his right ankle. He was prescribed allopurinol which he took for a time but then stopped. Started back when these symptoms started and not helping. Most of his pain is in his first MTP. He noticed a bruise there. No redness or warmth. He has started limping and since his gait has changed he is getting some anterior ankle pain. He works in Journalist, newspaper. He most recently bought new shoes about 6 months ago and have been working well for him.  Rash -- noted on physical exam. Itchy rash on left shin x 6 months. Thinks his work boots keep irritating and that's why not getting better. He has been applying antifungal ointment inconsistently.  Review of Systems:  Review of Systems See HPI  Patient Active Problem List   Diagnosis Date Noted  . Acute gout 09/19/2015  . Hyperuricemia 09/19/2015    Prior to Admission medications   Medication Sig Start Date End Date Taking? Authorizing Provider  allopurinol (ZYLOPRIM) 100 MG tablet Take 1 tablet (100 mg total) by mouth daily. 09/19/15  Yes Peyton Najjar, MD    Allergies  Allergen Reactions  . Penicillins Itching and Rash    Past Surgical History:  Procedure Laterality Date  . COSMETIC SURGERY      Social History  Substance Use Topics  . Smoking status: Never Smoker  . Smokeless tobacco: Not on file  . Alcohol use No    Family History  Problem Relation Age of Onset  . Diabetes Mother   . Heart disease Mother   .  Hyperlipidemia Mother   . Hypertension Mother   . Cancer Father   . Diabetes Sister     Medication list has been reviewed and updated.  Physical Examination:  Physical Exam  Constitutional: He is oriented to person, place, and time. He appears well-developed and well-nourished. No distress.  HENT:  Head: Normocephalic and atraumatic.  Right Ear: Hearing normal.  Left Ear: Hearing normal.  Nose: Nose normal.  Eyes: Conjunctivae and lids are normal. Right eye exhibits no discharge. Left eye exhibits no discharge. No scleral icterus.  Pulmonary/Chest: Effort normal. No respiratory distress.  Musculoskeletal: Normal range of motion.       Right ankle: He exhibits normal range of motion, no swelling and no ecchymosis. Tenderness (mild, anterior ankle). No lateral malleolus and no medial malleolus tenderness found. Achilles tendon normal.       Left ankle: Normal.       Right foot: There is tenderness (1st MTP), bony tenderness and swelling (mild over 1st MTP and dorsum of foot). There is normal range of motion and normal capillary refill.       Left foot: Normal.       Feet:  There is mild bruising over 1st MTP. No erythema or warmth.  Neurological: He is alert and oriented to person, place, and time. Gait (antalgic) abnormal.  Skin: Skin is warm, dry and intact.  Left shin with 2 cm annular erythema with some mild scaling and scabbing  Psychiatric: He has a normal mood and affect. His speech is normal and behavior is normal. Thought content normal.   BP 118/80   Pulse 70   Temp 98 F (36.7 C) (Oral)   Resp 18   Ht 5' 8.5" (1.74 m)   Wt 255 lb (115.7 kg)   SpO2 97%   BMI 38.21 kg/m   Dg Ankle Complete Right  Result Date: 05/12/2016 CLINICAL DATA:  Right foot and ankle joint pain. EXAM: RIGHT ANKLE - COMPLETE 3+ VIEW COMPARISON:  None. FINDINGS: There is no evidence of fracture, dislocation, or joint effusion. There is no evidence of arthropathy or other focal bone abnormality.  Soft tissues are unremarkable. IMPRESSION: Negative. Electronically Signed   By: Charlett Nose M.D.   On: 05/12/2016 09:58  Dg Foot Complete Right  Result Date: 05/12/2016 CLINICAL DATA:  Right foot pain in region of first MTP joint. EXAM: RIGHT FOOT COMPLETE - 3+ VIEW COMPARISON:  12/24/2013 FINDINGS: There is no evidence of fracture or dislocation. No signs of arthropathy in the region the first MTP joint or elsewhere. Stable plantar and dorsal calcaneal bone spurs noted. IMPRESSION: Calcaneal bone spurs. No acute findings or other osseous abnormality. Electronically Signed   By: Myles Rosenthal M.D.   On: 05/12/2016 10:06  Assessment and Plan:  1. Pain in joint, ankle and foot, right Suspect contusion of some sort. Gout unlikely. Radiograph negative. Gave ACE wrap. Counseled on RICE therapy. Gave work note. Return in 1 week if symptoms not improving. - DG Ankle Complete Right; Future - DG Foot Complete Right; Future  2. Gouty arthritis Counseled on diet. Advised not to take allopurinol since first flare only 8 months ago. Wait and see if preventive medication needed.  3. Rash Advised mix hydrocortisone with antifungal and apply BID. If not getting better in 2 weeks, return for biopsy.   [place] Miners Dyke Brackett, MHS [facility]  05/12/2016

## 2016-08-28 ENCOUNTER — Ambulatory Visit: Payer: 59

## 2016-09-14 ENCOUNTER — Ambulatory Visit (INDEPENDENT_AMBULATORY_CARE_PROVIDER_SITE_OTHER): Payer: 59 | Admitting: Family Medicine

## 2016-09-14 VITALS — BP 138/84 | HR 109 | Temp 97.5°F | Resp 17 | Ht 68.5 in | Wt 266.0 lb

## 2016-09-14 DIAGNOSIS — Z23 Encounter for immunization: Secondary | ICD-10-CM

## 2016-09-14 DIAGNOSIS — E669 Obesity, unspecified: Secondary | ICD-10-CM

## 2016-09-14 DIAGNOSIS — Z0000 Encounter for general adult medical examination without abnormal findings: Secondary | ICD-10-CM

## 2016-09-14 DIAGNOSIS — E782 Mixed hyperlipidemia: Secondary | ICD-10-CM | POA: Diagnosis not present

## 2016-09-14 DIAGNOSIS — Z833 Family history of diabetes mellitus: Secondary | ICD-10-CM | POA: Diagnosis not present

## 2016-09-14 DIAGNOSIS — Z8739 Personal history of other diseases of the musculoskeletal system and connective tissue: Secondary | ICD-10-CM

## 2016-09-14 DIAGNOSIS — E785 Hyperlipidemia, unspecified: Secondary | ICD-10-CM | POA: Insufficient documentation

## 2016-09-14 LAB — URIC ACID: Uric Acid, Serum: 7.9 mg/dL (ref 4.0–8.0)

## 2016-09-14 LAB — CBC
HEMATOCRIT: 47.2 % (ref 38.5–50.0)
HEMOGLOBIN: 16.6 g/dL (ref 13.2–17.1)
MCH: 31 pg (ref 27.0–33.0)
MCHC: 35.2 g/dL (ref 32.0–36.0)
MCV: 88.1 fL (ref 80.0–100.0)
MPV: 11 fL (ref 7.5–12.5)
Platelets: 215 10*3/uL (ref 140–400)
RBC: 5.36 MIL/uL (ref 4.20–5.80)
RDW: 14 % (ref 11.0–15.0)
WBC: 6.7 10*3/uL (ref 3.8–10.8)

## 2016-09-14 LAB — HEMOGLOBIN A1C
Hgb A1c MFr Bld: 5.1 % (ref <5.7)
Mean Plasma Glucose: 100 mg/dL

## 2016-09-14 NOTE — Patient Instructions (Addendum)
Work hard on eating less and getting regular exercise.  We will let you know the results of your labs over the next week or 10 days. Contact us if you for any reason to not hear back.  Return as needed    IF you received an x-ray today, you will receive an invoice from [facility]. Please contact [facility] at [phone] with questions or concerns regarding your invoice.   IF you received labwork today, you will receive an invoice from [facility]. Please contact [facility] at [phone] with questions or concerns regarding your invoice.   Our billing staff will not be able to assist you with questions regarding bills from these companies.  You will be contacted with the lab results as soon as they are available. The fastest way to get your results is to activate your My Chart account. Instructions are located on the last page of this paperwork. If you have not heard from us regarding the results in 2 weeks, please contact this office.

## 2016-09-14 NOTE — Progress Notes (Signed)
Patient ID: Scott SpanielJames A Ganoe, male    DOB: 10/02/1986  Age: 30 y.o. MRN: [id number]  Chief Complaint  Patient presents with  . Annual Exam    Work     Subjective:   Annual physical examination:  History: Patient is here for a annual physical examination that he needs for his insurance purposes and for his job place. He has a simple form that needs to be filled out regarding that. He has no major acute medical complaints this time. Past medical history: Illnesses: History of gout, controlled. Allergies.  Surgeries: LASIK eye surgery; right knee lateral meniscus this repair  Medications: None  Allergies: Penicillin and sulfa  Family history: Father had prostate cancer and is alive and well. Mother has diabetes as does his sister.  Social history: Does not smoke, drink, or use illicit drugs. He is single. Has one regular male sexual partner. 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  Review of systems: Constitutional: Unremarkable HEENT: Unremarkable Respiratory: Unremarkable Cardiovascular: Unremarkable Current gastrointestinal: Occasional heartburn relieved by Tums, not a major symptom. Genitourinary: Unremarkable Endocrine: Unremarkable Musculoskeletal: Unremarkable He did have an episode of plantar fasciitis (resolved. Dermatologic: Unremarkable Allergy/immunology: Unremarkable Neurologic: Unremarkable Hematologic: Unremarkable Psychiatric: Unremarkable   Current allergies, medications, problem list, past/family and social histories reviewed.  Objective:  BP 138/84 (BP Location: Right Arm, Patient Position: Sitting, Cuff Size: Large)   Pulse (!) 109   Temp 97.5 F (36.4 C) (Oral)   Resp 17   Ht 5' 8.5" (1.74 m)   Wt 266 lb (120.7 kg)   SpO2 97%   BMI 39.86 kg/m   Overweight white male, no acute distress, fully alert and oriented. TMs are normal. Eyes PERRLA. Throat clear and teeth satisfactory. Without nodes or thyromegaly. No carotid bruits. Chest is clear  to auscultation. Heart regular without murmur. Himself without mass or tenderness. Normal male external genitalia with testes descended. No hernias. Extremities grossly normal. Skin grossly normal.  Assessment & Plan:   Assessment: 1. Family history of diabetes mellitus   2. Annual physical exam   3. Obesity without serious comorbidity, unspecified classification, unspecified obesity type   4. Mixed hyperlipidemia   5. History of gout   6. Needs flu shot       Plan: With his history of gout we'll recheck a uric acid. Last year his lipids were high so we will check that.  Chooses not to have std testing   Orders Placed This Encounter  Procedures  . Flu Vaccine QUAD 36+ mos IM  . CBC  . Comprehensive metabolic panel  . Lipid panel  . Uric acid  . TSH  . Hemoglobin A1c    No orders of the defined types were placed in this encounter.        Patient Instructions   Work hard on eating less and getting regular exercise.  We will let you know the results of your labs over the next week or 10 days. Contact us if you for any reason to not hear back.  Return as needed    IF you received an x-ray today, you will receive an invoice from [facility]. Please contact [facility] at [phone] with questions or concerns regarding your invoice.   IF you received labwork today, you will receive an invoice from [facility]. Please contact [facility] at [phone] with questions or concerns regarding your invoice.   Our billing staff will not be able to assist you with questions regarding bills  from these companies.  You will be contacted with the lab results as soon as they are available. The fastest way to get your results is to activate your My Chart account. Instructions are located on the last page of this paperwork. If you have not heard from us regarding the results in 2 weeks, please contact this office.         No  Follow-up on file.   HOPPER,DAVID, MD 09/14/2016

## 2016-09-15 LAB — COMPREHENSIVE METABOLIC PANEL
ALBUMIN: 4.5 g/dL (ref 3.6–5.1)
ALT: 33 U/L (ref 9–46)
AST: 23 U/L (ref 10–40)
Alkaline Phosphatase: 62 U/L (ref 40–115)
BILIRUBIN TOTAL: 0.5 mg/dL (ref 0.2–1.2)
BUN: 14 mg/dL (ref 7–25)
CALCIUM: 9.4 mg/dL (ref 8.6–10.3)
CO2: 26 mmol/L (ref 20–31)
Chloride: 104 mmol/L (ref 98–110)
Creat: 0.96 mg/dL (ref 0.60–1.35)
Glucose, Bld: 90 mg/dL (ref 65–99)
POTASSIUM: 4.3 mmol/L (ref 3.5–5.3)
Sodium: 139 mmol/L (ref 135–146)
Total Protein: 7.3 g/dL (ref 6.1–8.1)

## 2016-09-15 LAB — LIPID PANEL
CHOL/HDL RATIO: 9.5 ratio — AB (ref <5.0)
Cholesterol: 247 mg/dL — ABNORMAL HIGH (ref <200)
HDL: 26 mg/dL — AB (ref >40)
TRIGLYCERIDES: 623 mg/dL — AB (ref <150)

## 2016-09-15 LAB — TSH: TSH: 1.52 mIU/L (ref 0.40–4.50)

## 2016-09-26 ENCOUNTER — Encounter: Payer: Self-pay | Admitting: *Deleted

## 2017-02-01 IMAGING — DX DG ANKLE COMPLETE 3+V*R*
4 series · 4 of 4 positions shown · non-contrast
Comparison: None.

CLINICAL DATA: Right foot and ankle joint pain.

EXAM:
RIGHT ANKLE - COMPLETE 3+ VIEW

[ankle ap]
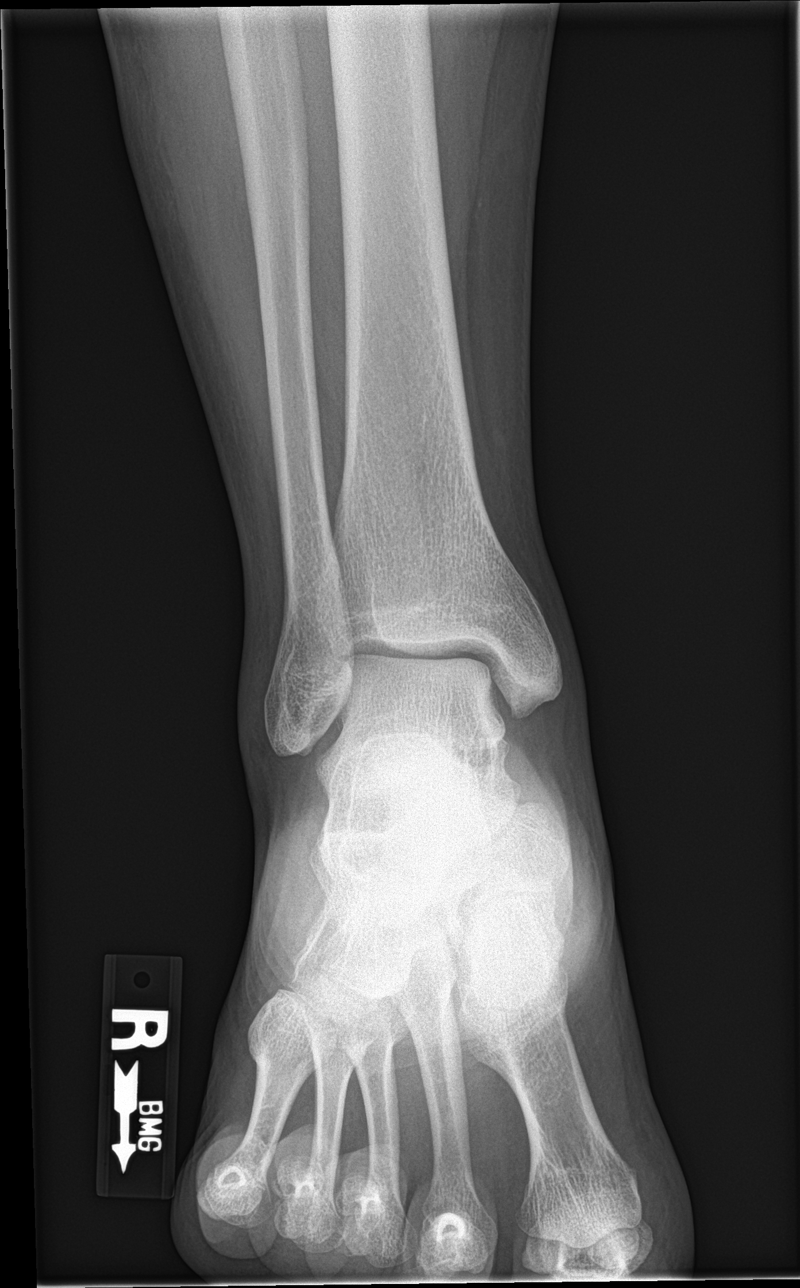

[ankle obl (1 of 2)]
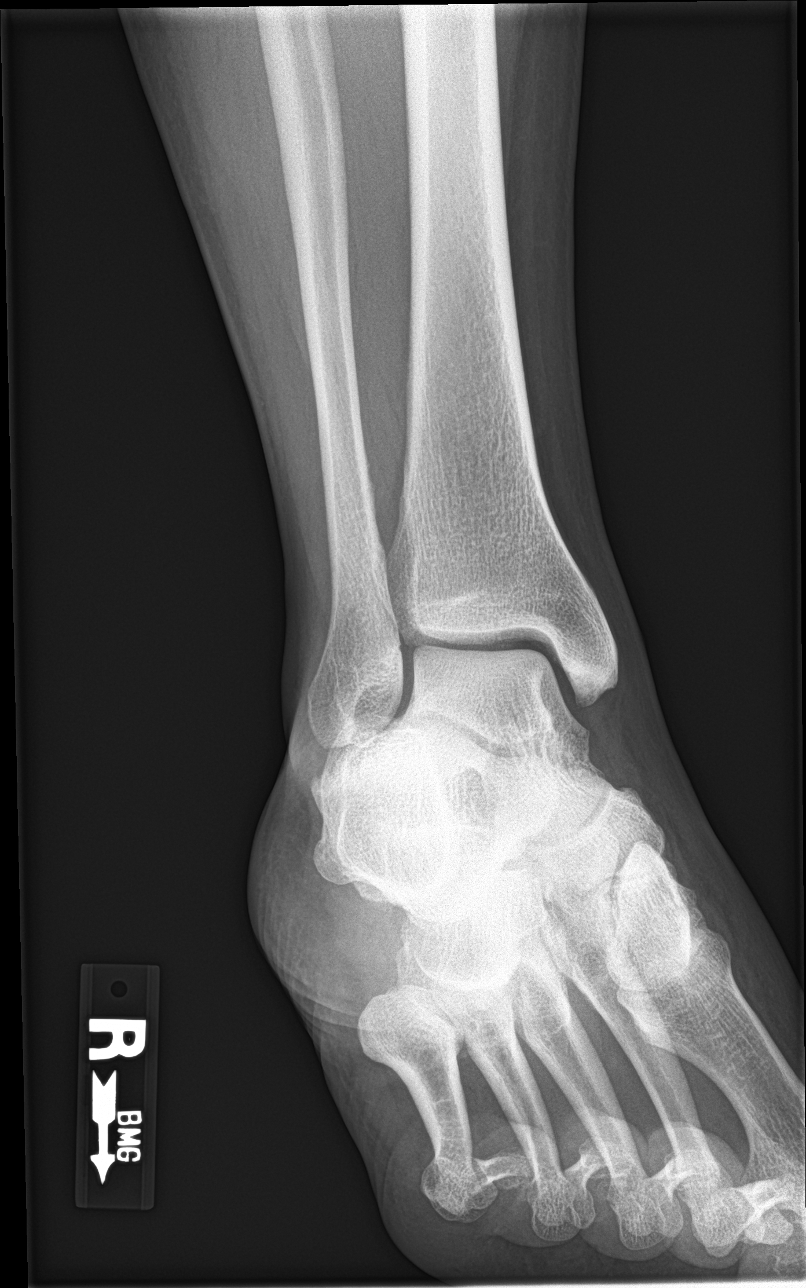

[ankle lat]
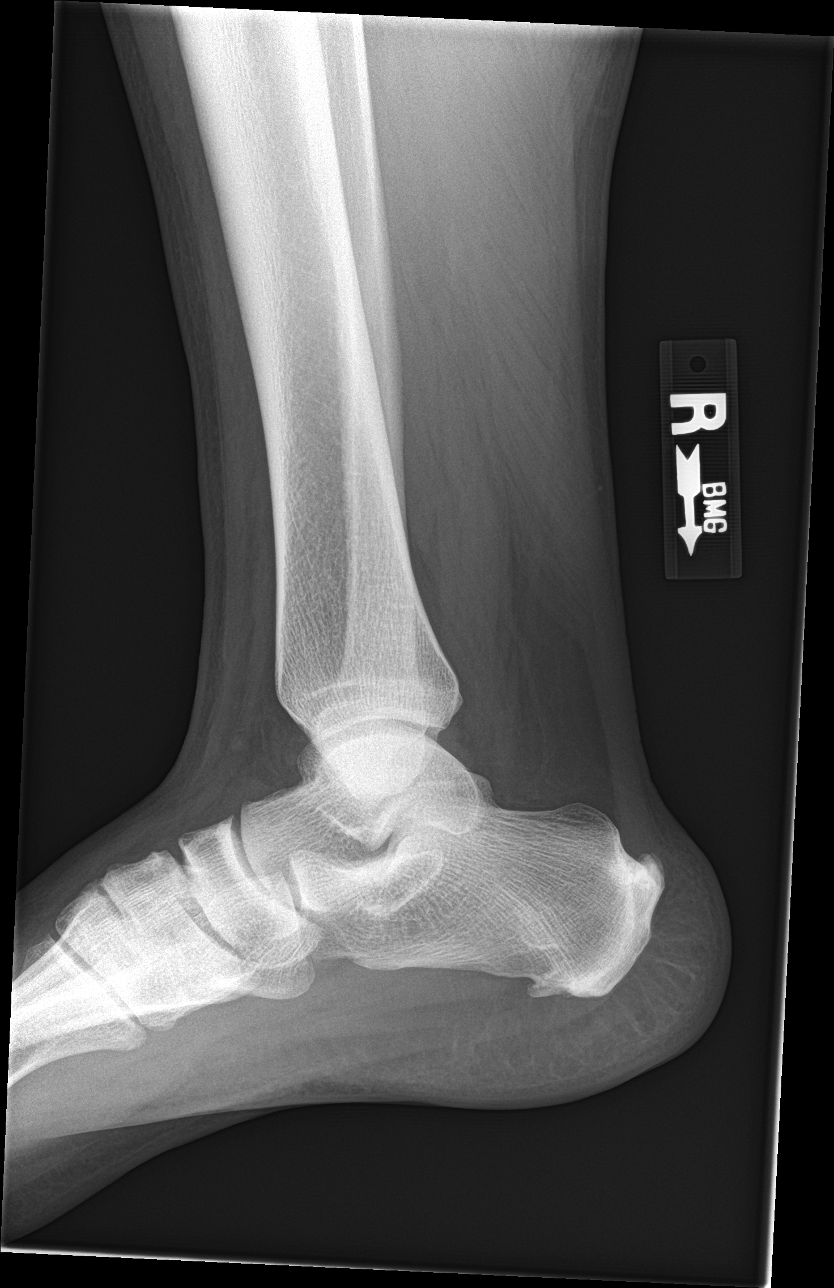

[ankle obl (2 of 2)]
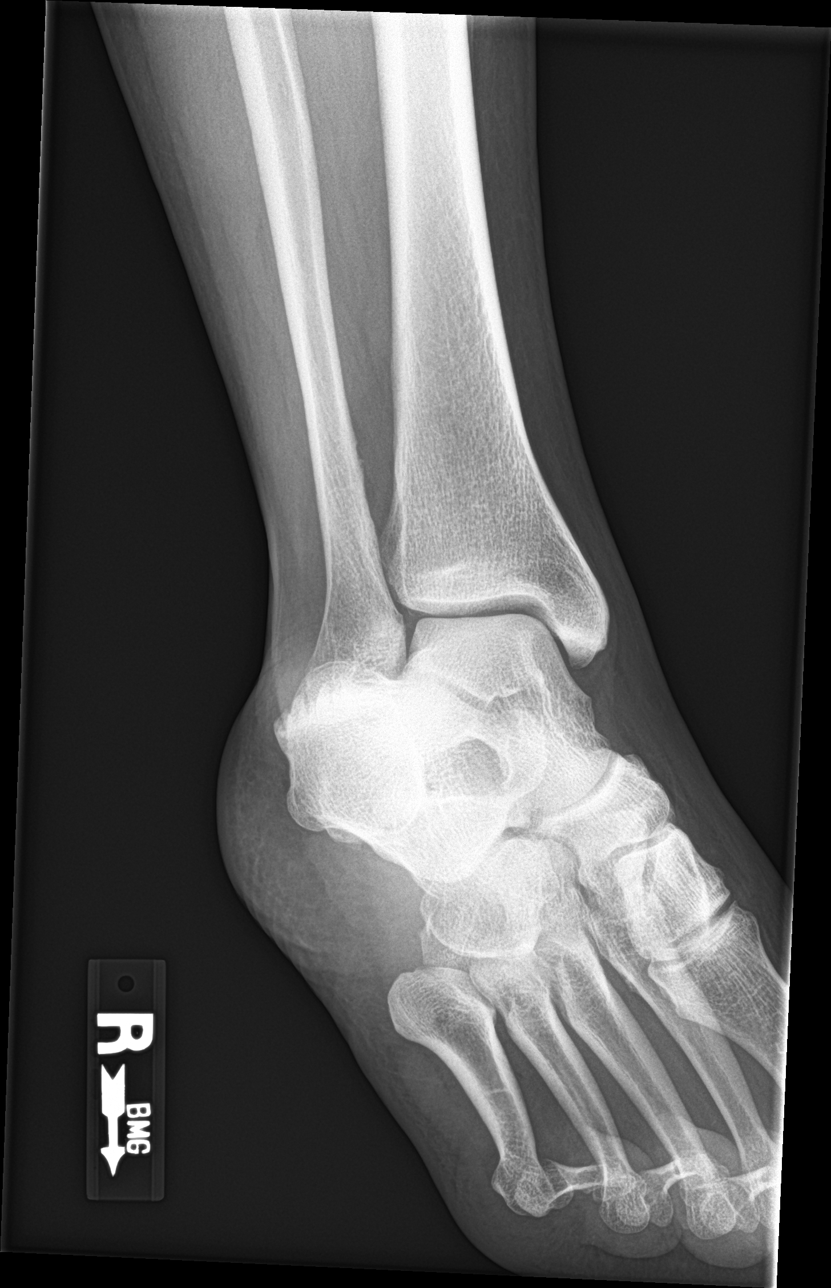

[4 of 4 positions shown; findings below may reference images not displayed]

FINDINGS: There is no evidence of fracture, dislocation, or joint effusion.
There is no evidence of arthropathy or other focal bone abnormality.
Soft tissues are unremarkable.
IMPRESSION: Negative.

## 2017-09-21 ENCOUNTER — Ambulatory Visit (INDEPENDENT_AMBULATORY_CARE_PROVIDER_SITE_OTHER): Payer: 59 | Admitting: Physician Assistant

## 2017-09-21 ENCOUNTER — Encounter: Payer: Self-pay | Admitting: Physician Assistant

## 2017-09-21 ENCOUNTER — Other Ambulatory Visit: Payer: Self-pay

## 2017-09-21 VITALS — BP 120/88 | HR 91 | Temp 98.5°F | Resp 18 | Ht 68.7 in | Wt 259.6 lb

## 2017-09-21 DIAGNOSIS — Z23 Encounter for immunization: Secondary | ICD-10-CM | POA: Diagnosis not present

## 2017-09-21 DIAGNOSIS — Z0000 Encounter for general adult medical examination without abnormal findings: Secondary | ICD-10-CM | POA: Diagnosis not present

## 2017-09-21 DIAGNOSIS — Z1389 Encounter for screening for other disorder: Secondary | ICD-10-CM | POA: Diagnosis not present

## 2017-09-21 DIAGNOSIS — E785 Hyperlipidemia, unspecified: Secondary | ICD-10-CM | POA: Diagnosis not present

## 2017-09-21 DIAGNOSIS — Z9189 Other specified personal risk factors, not elsewhere classified: Secondary | ICD-10-CM | POA: Diagnosis not present

## 2017-09-21 DIAGNOSIS — Z130 Encounter for screening for diseases of the blood and blood-forming organs and certain disorders involving the immune mechanism: Secondary | ICD-10-CM

## 2017-09-21 DIAGNOSIS — Z1329 Encounter for screening for other suspected endocrine disorder: Secondary | ICD-10-CM

## 2017-09-21 DIAGNOSIS — Z13228 Encounter for screening for other metabolic disorders: Secondary | ICD-10-CM

## 2017-09-21 LAB — POCT URINALYSIS DIP (MANUAL ENTRY)
BILIRUBIN UA: NEGATIVE mg/dL
Bilirubin, UA: NEGATIVE
Glucose, UA: NEGATIVE mg/dL
Leukocytes, UA: NEGATIVE
Nitrite, UA: NEGATIVE
PROTEIN UA: NEGATIVE mg/dL
RBC UA: NEGATIVE
SPEC GRAV UA: 1.015 (ref 1.010–1.025)
Urobilinogen, UA: 0.2 E.U./dL
pH, UA: 7 (ref 5.0–8.0)

## 2017-09-21 NOTE — Progress Notes (Signed)
[name]  MRN: [id number] DOB: 04-Dec-1985  Subjective:  Pt is a 31 y.o. male who presents for annual physical exam and completion of work physical. He is not fasting today. Last ate a chicken rice bowl from [employer] 4 hours prior to arrival.   Last annual exam: 09/2017 Last dental exam: Years ago, brushes teeth once or twice a week Last vision exam: Years ago  Vaccinations      Tetanus: >10 years ago      Influenza: 2017  He is having normal BMs daily. Denies urinary issues. Has no questions or complaints today.   Chronic issues:  Gout: Used to take allopurinol. Stopped taking it a while ago. He has not had a flare since. Does not want to restart medication at this time.  Hyperlipidemia: Last lipid panel was in 2017. TC elevated at 247. Triglycerides elevated at 623. Pt was given Rx for atorvastatin but he never picked it up.  Patient Active Problem List   Diagnosis Date Noted  . Annual physical exam 09/14/2016  . Obesity 09/14/2016  . Hyperlipidemia 09/14/2016  . Acute gout 09/19/2015  . Hyperuricemia 09/19/2015    Current Outpatient Medications on File Prior to Visit  Medication Sig Dispense Refill  . allopurinol (ZYLOPRIM) 100 MG tablet Take 1 tablet (100 mg total) by mouth daily. (Patient not taking: Reported on 09/14/2016) 30 tablet 6   No current facility-administered medications on file prior to visit.     Allergies  Allergen Reactions  . Penicillins Itching and Rash    Social History   Socioeconomic History  . Marital status: Single    Spouse name: None  . Number of children: 0  . Years of education: None  . Highest education level: None  Social Needs  . Financial resource strain: Not hard at all  . Food insecurity - worry: Never true  . Food insecurity - inability: Never true  . Transportation needs - medical: No  . Transportation needs - non-medical: No  Occupational History  . Occupation: Community education officer: [employer]  Tobacco Use  . Smoking status: Never Smoker  . Smokeless tobacco: Never Used  Substance and Sexual Activity  . Alcohol use: No  . Drug use: No  . Sexual activity: Yes    Partners: Female    Comment: with monogamous partner  Other Topics Concern  . None  Social History Narrative   Pt is from [place]. He currently lives with mom and dad.      Diet: Typically eats protein bar for breakfast, sub sandwich for lunch, and fast food for dinner. Drinks a lot of water.       Exercise: Does structured exercise 2-3 days a week for 30 minutes at time. Typically does a mix of cardio and strength training.     Past Surgical History:  Procedure Laterality Date  . COSMETIC SURGERY    . KNEE SURGERY Right    2007    Family History  Problem Relation Age of Onset  . Diabetes Mother   . Heart disease Mother   . Hyperlipidemia Mother   . Hypertension Mother   . Cancer Father   . Diabetes Sister     Review of Systems  Constitutional: Negative for activity change, appetite change, chills, diaphoresis, fatigue, fever and unexpected weight change.  HENT: Negative for congestion, dental problem, drooling, ear discharge, ear pain, facial swelling, hearing loss, mouth sores, nosebleeds, postnasal drip, rhinorrhea, sinus pressure, sinus  pain, sneezing, sore throat, tinnitus, trouble swallowing and voice change.   Eyes: Negative for photophobia, pain, discharge, redness, itching and visual disturbance.  Respiratory: Negative for apnea, cough, choking, chest tightness, shortness of breath, wheezing and stridor.   Cardiovascular: Negative for chest pain, palpitations and leg swelling.  Gastrointestinal: Negative for abdominal distention, abdominal pain, anal bleeding, blood in stool, constipation, diarrhea, nausea, rectal pain and vomiting.  Endocrine: Negative for cold intolerance, heat intolerance, polydipsia, polyphagia and polyuria.  Genitourinary: Negative for decreased urine volume,  difficulty urinating, discharge, dysuria, enuresis, flank pain, frequency, genital sores, hematuria, penile pain, penile swelling, scrotal swelling, testicular pain and urgency.  Musculoskeletal: Negative for arthralgias, back pain, gait problem, joint swelling, myalgias, neck pain and neck stiffness.  Skin: Negative for color change, pallor, rash and wound.  Allergic/Immunologic: Positive for environmental allergies. Negative for food allergies and immunocompromised state.  Neurological: Negative for dizziness, tremors, seizures, syncope, facial asymmetry, speech difficulty, weakness, light-headedness, numbness and headaches.  Hematological: Negative for adenopathy. Does not bruise/bleed easily.  Psychiatric/Behavioral: Negative for agitation, behavioral problems, confusion, decreased concentration, dysphoric mood, hallucinations, self-injury, sleep disturbance and suicidal ideas. The patient is not nervous/anxious and is not hyperactive.     Objective:  BP 120/88 (BP Location: Left Arm, Patient Position: Sitting, Cuff Size: Large)   Pulse 91   Temp 98.5 F (36.9 C) (Oral)   Resp 18   Ht 5' 8.7" (1.745 m)   Wt 259 lb 9.6 oz (117.8 kg)   SpO2 98%   BMI 38.67 kg/m   Physical Exam  Constitutional: He is oriented to person, place, and time and well-developed, well-nourished, and in no distress.  HENT:  Head: Normocephalic and atraumatic.  Right Ear: Hearing, tympanic membrane, external ear and ear canal normal.  Left Ear: Hearing, tympanic membrane, external ear and ear canal normal.  Nose: Nose normal.  Mouth/Throat: Uvula is midline, oropharynx is clear and moist and mucous membranes are normal. Abnormal dentition. No oropharyngeal exudate.  Eyes: Conjunctivae and EOM are normal. Pupils are equal, round, and reactive to light.  Neck: Trachea normal and normal range of motion.  Cardiovascular: Normal rate, regular rhythm, normal heart sounds and intact distal pulses.  Pulmonary/Chest:  Effort normal and breath sounds normal.  Abdominal: Soft. Normal appearance and bowel sounds are normal.  Musculoskeletal: Normal range of motion.  Lymphadenopathy:       Head (right side): No submental, no submandibular, no tonsillar, no preauricular, no posterior auricular and no occipital adenopathy present.       Head (left side): No submental, no submandibular, no tonsillar, no preauricular, no posterior auricular and no occipital adenopathy present.    He has no cervical adenopathy.       Right: No supraclavicular adenopathy present.       Left: No supraclavicular adenopathy present.  Neurological: He is alert and oriented to person, place, and time. He has normal sensation, normal strength and normal reflexes. Gait normal.  Skin: Skin is warm and dry.  Psychiatric: Affect normal.  Vitals reviewed.   Visual Acuity Screening   Right eye Left eye Both eyes  Without correction: '20/20 20/15 20/13 '  With correction:      Results for orders placed or performed in visit on 09/21/17 (from the past 24 hour(s))  POCT urinalysis dipstick     Status: Normal   Collection Time: 09/21/17  3:23 PM  Result Value Ref Range   Color, UA yellow yellow   Clarity, UA clear clear   Glucose,  UA negative negative mg/dL   Bilirubin, UA negative negative   Ketones, POC UA negative negative mg/dL   Spec Grav, UA 1.015 1.010 - 1.025   Blood, UA negative negative   pH, UA 7.0 5.0 - 8.0   Protein Ur, POC negative negative mg/dL   Urobilinogen, UA 0.2 0.2 or 1.0 E.U./dL   Nitrite, UA Negative Negative   Leukocytes, UA Negative Negative     Assessment and Plan :  Discussed healthy lifestyle, diet, exercise, preventative care, vaccinations, and addressed patient's concerns. Plan for follow up in one year. Otherwise, plan for specific conditions below.  1. Annual physical exam Await lab results.  Work form completed and given to patient.  2. Hyperlipidemia, unspecified hyperlipidemia type Lipid panel  pending.  Depending on results today, patient may need to come in for repeat lipid panel when he is fasting. - Lipid panel  3. Screening for metabolic disorder - [id number]+GKBO  4. Screening for thyroid disorder - TSH  5. Screening for hematuria or proteinuria - POCT urinalysis dipstick  7. Need for influenza vaccination - Flu Vaccine QUAD 36+ mos IM  8. Need for Tdap vaccination  - Tdap vaccine greater than or equal to 7yo IM  9. Poor dental hygiene Patient encouraged to schedule dentist appointment.  Also encouraged to start brushing teeth daily.   Tenna Delaine, PA-C  [facility] 09/21/2017 3:32 PM

## 2017-09-21 NOTE — Patient Instructions (Addendum)
Below is some information about health maintenance for males.  I recommend increasing your exercise to 150 minutes/week.  Try to decrease the amount of fast food you are consuming each week.  Also recommend scheduling a dentist appointment this year.  Make sure you start to brush your teeth daily.  In terms of labs, we should have the results back within 1 week.  We will contact you with these results. Thank you for letting me participate in your health and well being.   Health Maintenance, Male A healthy lifestyle and preventive care is important for your health and wellness. Ask your health care provider about what schedule of regular examinations is right for you. What should I know about weight and diet? Eat a Healthy Diet  Eat plenty of vegetables, fruits, whole grains, low-fat dairy products, and lean protein.  Do not eat a lot of foods high in solid fats, added sugars, or salt.  Maintain a Healthy Weight Regular exercise can help you achieve or maintain a healthy weight. You should:  Do at least 150 minutes of exercise each week. The exercise should increase your heart rate and make you sweat (moderate-intensity exercise).  Do strength-training exercises at least twice a week.  Watch Your Levels of Cholesterol and Blood Lipids  Have your blood tested for lipids and cholesterol every 5 years starting at 32 years of age. If you are at high risk for heart disease, you should start having your blood tested when you are 32 years old. You may need to have your cholesterol levels checked more often if: ? Your lipid or cholesterol levels are high. ? You are older than 32 years of age. ? You are at high risk for heart disease.  What should I know about cancer screening? Many types of cancers can be detected early and may often be prevented. Lung Cancer  You should be screened every year for lung cancer if: ? You are a current smoker who has smoked for at least 30 years. ? You are a  former smoker who has quit within the past 15 years.  Talk to your health care provider about your screening options, when you should start screening, and how often you should be screened.  Colorectal Cancer  Routine colorectal cancer screening usually begins at 32 years of age and should be repeated every 5-10 years until you are 32 years old. You may need to be screened more often if early forms of precancerous polyps or small growths are found. Your health care provider may recommend screening at an earlier age if you have risk factors for colon cancer.  Your health care provider may recommend using home test kits to check for hidden blood in the stool.  A small camera at the end of a tube can be used to examine your colon (sigmoidoscopy or colonoscopy). This checks for the earliest forms of colorectal cancer.  Prostate and Testicular Cancer  Depending on your age and overall health, your health care provider may do certain tests to screen for prostate and testicular cancer.  Talk to your health care provider about any symptoms or concerns you have about testicular or prostate cancer.  Skin Cancer  Check your skin from head to toe regularly.  Tell your health care provider about any new moles or changes in moles, especially if: ? There is a change in a mole's size, shape, or color. ? You have a mole that is larger than a pencil eraser.  Always use sunscreen.  Apply sunscreen liberally and repeat throughout the day.  Protect yourself by wearing long sleeves, pants, a wide-brimmed hat, and sunglasses when outside.  What should I know about heart disease, diabetes, and high blood pressure?  If you are 4118-32 years of age, have your blood pressure checked every 3-5 years. If you are 32 years of age or older, have your blood pressure checked every year. You should have your blood pressure measured twice-once when you are at a hospital or clinic, and once when you are not at a hospital or  clinic. Record the average of the two measurements. To check your blood pressure when you are not at a hospital or clinic, you can use: ? An automated blood pressure machine at a pharmacy. ? A home blood pressure monitor.  Talk to your health care provider about your target blood pressure.  If you are between 3845-327 years old, ask your health care provider if you should take aspirin to prevent heart disease.  Have regular diabetes screenings by checking your fasting blood sugar level. ? If you are at a normal weight and have a low risk for diabetes, have this test once every three years after the age of 32. ? If you are overweight and have a high risk for diabetes, consider being tested at a younger age or more often.  A one-time screening for abdominal aortic aneurysm (AAA) by ultrasound is recommended for men aged 65-75 years who are current or former smokers. What should I know about preventing infection? Hepatitis B If you have a higher risk for hepatitis B, you should be screened for this virus. Talk with your health care provider to find out if you are at risk for hepatitis B infection. Hepatitis C Blood testing is recommended for:  Everyone born from [birth year] through 1966.  Anyone with known risk factors for hepatitis C.  Sexually Transmitted Diseases (STDs)  You should be screened each year for STDs including gonorrhea and chlamydia if: ? You are sexually active and are younger than 32 years of age. ? You are older than 32 years of age and your health care provider tells you that you are at risk for this type of infection. ? Your sexual activity has changed since you were last screened and you are at an increased risk for chlamydia or gonorrhea. Ask your health care provider if you are at risk.  Talk with your health care provider about whether you are at high risk of being infected with HIV. Your health care provider may recommend a prescription medicine to help prevent HIV  infection.  What else can I do?  Schedule regular health, dental, and eye exams.  Stay current with your vaccines (immunizations).  Do not use any tobacco products, such as cigarettes, chewing tobacco, and e-cigarettes. If you need help quitting, ask your health care provider.  Limit alcohol intake to no more than 2 drinks per day. One drink equals 12 ounces of beer, 5 ounces of wine, or 1 ounces of hard liquor.  Do not use street drugs.  Do not share needles.  Ask your health care provider for help if you need support or information about quitting drugs.  Tell your health care provider if you often feel depressed.  Tell your health care provider if you have ever been abused or do not feel safe at home. This information is not intended to replace advice given to you by your health care provider. Make sure you discuss any questions you have  with your health care provider. Document Released: 04/02/2008 Document Revised: 06/03/2016 Document Reviewed: 07/09/2015 Elsevier Interactive Patient Education  2018 [employer].     IF you received an x-ray today, you will receive an invoice from [facility]. Please contact [facility] at [phone] with questions or concerns regarding your invoice.   IF you received labwork today, you will receive an invoice from [place]. Please contact [facility] at [phone] with questions or concerns regarding your invoice.   Our billing staff will not be able to assist you with questions regarding bills from these companies.  You will be contacted with the lab results as soon as they are available. The fastest way to get your results is to activate your My Chart account. Instructions are located on the last page of this paperwork. If you have not heard from [place] regarding the results in 2 weeks, please contact this office.

## 2017-09-22 LAB — CBC WITH DIFFERENTIAL/PLATELET
BASOS: 1 %
Basophils Absolute: 0 10*3/uL (ref 0.0–0.2)
EOS (ABSOLUTE): 0.3 10*3/uL (ref 0.0–0.4)
EOS: 5 %
HEMATOCRIT: 46.6 % (ref 37.5–51.0)
Hemoglobin: 16 g/dL (ref 13.0–17.7)
IMMATURE GRANULOCYTES: 0 %
Immature Grans (Abs): 0 10*3/uL (ref 0.0–0.1)
LYMPHS: 35 %
Lymphocytes Absolute: 2.1 10*3/uL (ref 0.7–3.1)
MCH: 30.9 pg (ref 26.6–33.0)
MCHC: 34.3 g/dL (ref 31.5–35.7)
MCV: 90 fL (ref 79–97)
Monocytes Absolute: 0.4 10*3/uL (ref 0.1–0.9)
Monocytes: 7 %
NEUTROS ABS: 3.1 10*3/uL (ref 1.4–7.0)
NEUTROS PCT: 52 %
Platelets: 183 10*3/uL (ref 150–379)
RBC: 5.18 x10E6/uL (ref 4.14–5.80)
RDW: 13.5 % (ref 12.3–15.4)
WBC: 5.9 10*3/uL (ref 3.4–10.8)

## 2017-09-22 LAB — CMP14+EGFR
A/G RATIO: 1.7 (ref 1.2–2.2)
ALBUMIN: 4.3 g/dL (ref 3.5–5.5)
ALT: 32 IU/L (ref 0–44)
AST: 21 IU/L (ref 0–40)
Alkaline Phosphatase: 67 IU/L (ref 39–117)
BUN / CREAT RATIO: 15 (ref 9–20)
BUN: 15 mg/dL (ref 6–20)
Bilirubin Total: 0.5 mg/dL (ref 0.0–1.2)
CALCIUM: 9.2 mg/dL (ref 8.7–10.2)
CHLORIDE: 104 mmol/L (ref 96–106)
CO2: 24 mmol/L (ref 20–29)
Creatinine, Ser: 0.97 mg/dL (ref 0.76–1.27)
GFR calc Af Amer: 119 mL/min/{1.73_m2} (ref >59)
GFR calc non Af Amer: 103 mL/min/{1.73_m2} (ref >59)
GLOBULIN, TOTAL: 2.6 g/dL (ref 1.5–4.5)
Glucose: 98 mg/dL (ref 65–99)
POTASSIUM: 4.1 mmol/L (ref 3.5–5.2)
Sodium: 144 mmol/L (ref 134–144)
TOTAL PROTEIN: 6.9 g/dL (ref 6.0–8.5)

## 2017-09-22 LAB — LIPID PANEL
CHOLESTEROL TOTAL: 218 mg/dL — AB (ref 100–199)
Chol/HDL Ratio: 7.3 ratio — ABNORMAL HIGH (ref 0.0–5.0)
HDL: 30 mg/dL — ABNORMAL LOW (ref >39)
Triglycerides: 407 mg/dL — ABNORMAL HIGH (ref 0–149)

## 2017-09-22 LAB — TSH: TSH: 1.82 u[IU]/mL (ref 0.450–4.500)

## 2017-10-02 ENCOUNTER — Other Ambulatory Visit: Payer: Self-pay | Admitting: Physician Assistant

## 2017-10-02 DIAGNOSIS — E785 Hyperlipidemia, unspecified: Secondary | ICD-10-CM

## 2017-10-13 ENCOUNTER — Ambulatory Visit: Payer: 59 | Admitting: Physician Assistant

## 2017-10-13 ENCOUNTER — Encounter: Payer: Self-pay | Admitting: Physician Assistant

## 2017-10-13 ENCOUNTER — Other Ambulatory Visit: Payer: Self-pay

## 2017-10-13 VITALS — BP 100/78 | HR 90 | Temp 98.5°F | Ht 68.9 in | Wt 258.0 lb

## 2017-10-13 DIAGNOSIS — R112 Nausea with vomiting, unspecified: Secondary | ICD-10-CM

## 2017-10-13 DIAGNOSIS — E782 Mixed hyperlipidemia: Secondary | ICD-10-CM | POA: Diagnosis not present

## 2017-10-13 DIAGNOSIS — J029 Acute pharyngitis, unspecified: Secondary | ICD-10-CM

## 2017-10-13 LAB — POC INFLUENZA A&B (BINAX/QUICKVUE)
INFLUENZA A, POC: NEGATIVE
INFLUENZA B, POC: NEGATIVE

## 2017-10-13 LAB — POCT RAPID STREP A (OFFICE): RAPID STREP A SCREEN: NEGATIVE

## 2017-10-13 MED ORDER — AZITHROMYCIN 250 MG PO TABS
ORAL_TABLET | ORAL | 0 refills | Status: DC
Start: 2017-10-13 — End: 2018-03-21

## 2017-10-13 MED ORDER — ONDANSETRON 4 MG PO TBDP: 4.0000 mg | ORAL_TABLET | Freq: Three times a day (TID) | ORAL | 0 refills | Status: DC | PRN

## 2017-10-13 NOTE — Patient Instructions (Addendum)
Your rapid strep and flue teste were negative. Due to the duration of your sore throat symptoms and the fact that they are worsening, will plan to treat with antibiotics at this time. Culture is pending. Please take antibiotics as prescribed. I also recommend using ibuprofen 600-800 mg every 8 hours for sore throat.  Warm salt water gargles may also help.  In terms of the vomiting and nausea, it is likely due to gastroenteritis.  I have given you some Zofran to take if you start to feel nauseous throughout the day.  If you develop any worsening vomiting or new abdominal pain, fever, chills, inability to pass gas or stools, seek care immediately.  Otherwise, focus on a bland diet like bananas, rice, apple, and toast until you feel completely better.  Continue drinking fluids.   Sore Throat When you have a sore throat, your throat may:  Hurt.  Burn.  Feel irritated.  Feel scratchy.  Many things can cause a sore throat, including:  An infection.  Allergies.  Dryness in the air.  Smoke or pollution.  Gastroesophageal reflux disease (GERD).  A tumor.  A sore throat can be the first sign of another sickness. It can happen with other problems, like coughing or a fever. Most sore throats go away without treatment. Follow these instructions at home:  Take over-the-counter medicines only as told by your doctor.  Drink enough fluids to keep your pee (urine) clear or pale yellow.  Rest when you feel you need to.  To help with pain, try: ? Sipping warm liquids, such as broth, herbal tea, or warm water. ? Eating or drinking cold or frozen liquids, such as frozen ice pops. ? Gargling with a salt-water mixture 3-4 times a day or as needed. To make a salt-water mixture, add -1 tsp of salt in 1 cup of warm water. Mix it until you cannot see the salt anymore. ? Sucking on hard candy or throat lozenges. ? Putting a cool-mist humidifier in your bedroom at night. ? Sitting in the bathroom with  the door closed for 5-10 minutes while you run hot water in the shower.  Do not use any tobacco products, such as cigarettes, chewing tobacco, and e-cigarettes. If you need help quitting, ask your doctor. Contact a doctor if:  You have a fever for more than 2-3 days.  You keep having symptoms for more than 2-3 days.  Your throat does not get better in 7 days.  You have a fever and your symptoms suddenly get worse. Get help right away if:  You have trouble breathing.  You cannot swallow fluids, soft foods, or your saliva.  You have swelling in your throat or neck that gets worse.  You keep feeling like you are going to throw up (vomit).  You keep throwing up. This information is not intended to replace advice given to you by your health care provider. Make sure you discuss any questions you have with your health care provider. Document Released: 07/14/2008 Document Revised: 05/31/2016 Document Reviewed: 07/26/2015 Elsevier Interactive Patient Education  2018 [employer]   [place] Diet A bland diet consists of foods that do not have a lot of fat or fiber. Foods without fat or fiber are easier for the body to digest. They are also less likely to irritate your mouth, throat, stomach, and other parts of your gastrointestinal tract. A bland diet is sometimes called a BRAT diet. What is my plan? Your health care provider or dietitian may recommend specific  changes to your diet to prevent and treat your symptoms, such as:  Eating small meals often.  Cooking food until it is soft enough to chew easily.  Chewing your food well.  Drinking fluids slowly.  Not eating foods that are very spicy, sour, or fatty.  Not eating citrus fruits, such as oranges and grapefruit.  What do I need to know about this diet?  Eat a variety of foods from the bland diet food list.  Do not follow a bland diet longer than you have to.  Ask your health care provider whether you should take  vitamins. What foods can I eat? Grains  Hot cereals, such as cream of wheat. Bread, crackers, or tortillas made from refined white flour. Rice. Vegetables Canned or cooked vegetables. Mashed or boiled potatoes. Fruits Bananas. Applesauce. Other types of cooked or canned fruit with the skin and seeds removed, such as canned peaches or pears. Meats and Other Protein Sources Scrambled eggs. Creamy peanut butter or other nut butters. Lean, well-cooked meats, such as chicken or fish. Tofu. Soups or broths. Dairy Low-fat dairy products, such as milk, cottage cheese, or yogurt. Beverages Water. Herbal tea. Apple juice. Sweets and Desserts Pudding. Custard. Fruit gelatin. Ice cream. Fats and Oils Mild salad dressings. Canola or olive oil. The items listed above may not be a complete list of allowed foods or beverages. Contact your dietitian for more options. What foods are not recommended? Foods and ingredients that are often not recommended include:  Spicy foods, such as hot sauce or salsa.  Fried foods.  Sour foods, such as pickled or fermented foods.  Raw vegetables or fruits, especially citrus or berries.  Caffeinated drinks.  Alcohol.  Strongly flavored seasonings or condiments.  The items listed above may not be a complete list of foods and beverages that are not allowed. Contact your dietitian for more information. This information is not intended to replace advice given to you by your health care provider. Make sure you discuss any questions you have with your health care provider. Document Released: 01/27/2016 Document Revised: 03/12/2016 Document Reviewed: 10/17/2014 Elsevier Interactive Patient Education  2018 [employer]   IF you received an x-ray today, you will receive an invoice from [facility]. Please contact [facility] at [phone] with questions or concerns regarding your invoice.   IF you received labwork today, you will receive an  invoice from [facility]. Please contact [facility] at [phone] with questions or concerns regarding your invoice.   Our billing staff will not be able to assist you with questions regarding bills from these companies.  You will be contacted with the lab results as soon as they are available. The fastest way to get your results is to activate your My Chart account. Instructions are located on the last page of this paperwork. If you have not heard from us regarding the results in 2 weeks, please contact this office.

## 2017-10-13 NOTE — Progress Notes (Signed)
MRN: [id number] DOB: 03/16/1986  Subjective:   Scott SpanielJames A Huge is a 31 y.o. male presenting for chief complaint of Nausea (VOMITING. HAS BEEN FEELING THIS FOR THE PAST 2 DAYS) .  Reports 3 day history of worsening sore throat. Has tried zquil with no full relief. He then had two episodes of vomiting over night, resolved around 3 am. Denies nausea, abdominal pain, hematemesis, diarrhea, constipation, difficulty swallowing, voice change,sinus congestion, sinus pain, ear pain, difficulty swallowing, wheezing, shortness of breath, chest pain, myalgia and cough, night sweats, chills, fatigue and weight loss. In terms of odd food exposure, pt did eat at relatives house yesterday for holidays. Has continued to drink water but has not eaten today. Last BM was yesterday and was normal. Has had  sick contact with coworkers. Denies history of seasonal allergies. Patient has had flu shot this season. Denies smoking and alcohol use. Denies any other aggravating or relieving factors, no other questions or concerns.  Fayrene FearingJames has a current medication list which includes the following prescription(s): allopurinol. Also is allergic to penicillins.  Fayrene FearingJames  has a past medical history of Allergy, Gout, and Hyperlipidemia. Also  has a past surgical history that includes Cosmetic surgery and Knee surgery (Right).  Social History   Socioeconomic History  . Marital status: Single    Spouse name: Not on file  . Number of children: 0  . Years of education: Not on file  . Highest education level: Not on file  Social Needs  . Financial resource strain: Not hard at all  . Food insecurity - worry: Never true  . Food insecurity - inability: Never true  . Transportation needs - medical: No  . Transportation needs - non-medical: No  Occupational History  . Occupation: Brewing technologistndustrial Air Conditioning     Employer: [employer]  Tobacco Use  . Smoking status: Never Smoker  . Smokeless tobacco: Never Used  Substance and  Sexual Activity  . Alcohol use: No  . Drug use: No  . Sexual activity: Yes    Partners: Female    Comment: with monogamous partner  Other Topics Concern  . Not on file  Social History Narrative   [place] He currently lives with mom and dad.      Diet: Typically eats protein bar for breakfast, sub sandwich for lunch, and fast food for dinner. Drinks a lot of water.       Exercise: Does structured exercise 2-3 days a week for 30 minutes at time. Typically does a mix of cardio and strength training.       Objective:   Vitals: BP 100/78 (BP Location: Left Arm, Patient Position: Sitting, Cuff Size: Normal)   Pulse 90   Temp 98.5 F (36.9 C) (Oral)   Ht 5' 8.9" (1.75 m)   Wt 258 lb (117 kg)   SpO2 98%   BMI 38.21 kg/m   Physical Exam  Constitutional: He is oriented to person, place, and time. He appears well-developed and well-nourished. No distress.  HENT:  Head: Normocephalic and atraumatic.  Right Ear: Tympanic membrane, external ear and ear canal normal.  Left Ear: Tympanic membrane, external ear and ear canal normal.  Nose: No mucosal edema. Right sinus exhibits no maxillary sinus tenderness and no frontal sinus tenderness. Left sinus exhibits no maxillary sinus tenderness and no frontal sinus tenderness.  Mouth/Throat: Uvula is midline and mucous membranes are normal. Posterior oropharyngeal erythema present. Tonsils are 2+ on the right. Tonsils are 2+ on  the left. Tonsillar exudate (mild noted).  Foul breath noted.  Eyes: Conjunctivae are normal.  Neck: Normal range of motion.  Cardiovascular: Normal rate, regular rhythm and normal heart sounds.  Pulmonary/Chest: Effort normal and breath sounds normal. He has no wheezes. He has no rales.  Lymphadenopathy:       Head (right side): No submental, no submandibular, no tonsillar, no preauricular, no posterior auricular and no occipital adenopathy present.       Head (left side): No submental, no submandibular,  no tonsillar, no preauricular, no posterior auricular and no occipital adenopathy present.    He has cervical adenopathy.       Right cervical: Posterior cervical adenopathy present. No superficial cervical and no deep cervical adenopathy present.      Left cervical: Posterior cervical adenopathy present. No superficial cervical and no deep cervical adenopathy present.       Right: No supraclavicular adenopathy present.       Left: No supraclavicular adenopathy present.  Neurological: He is alert and oriented to person, place, and time.  Skin: Skin is warm and dry.  Psychiatric: He has a normal mood and affect.  Vitals reviewed.   Results for orders placed or performed in visit on 10/13/17 (from the past 24 hour(s))  POCT rapid strep A     Status: None   Collection Time: 10/13/17 12:19 PM  Result Value Ref Range   Rapid Strep A Screen Negative Negative  POC Influenza A&B(BINAX/QUICKVUE)     Status: None   Collection Time: 10/13/17 12:28 PM  Result Value Ref Range   Influenza A, POC Negative Negative   Influenza B, POC Negative Negative    Assessment and Plan :  1. Sore throat Rapid strep negative. Culture pending.  Due to center score of 3, will treat empirically at this time for underlying bacterial etiology.  Patient also recommended to take ibuprofen 600-800 mg every 8 hours for sore throat.  Encouraged to do warm salt water gargles.  Advised to return to clinic if symptoms worsen, do not improve, or as needed. - Culture, Group A Strep 2. Nausea and vomiting, intractability of vomiting not specified, unspecified vomiting type Resolved.  Likely due to gastroenteritis.  Patient is overall well-appearing.  He is afebrile.  No abdominal pain on exam.  Given Rx for Zofran to use if nausea returns. Recommended bland diet and advance diet as tolerated.  Advised to return if symptoms return or he develops any new concerning symptoms. - POCT rapid strep A - POC Influenza  A&B(BINAX/QUICKVUE) 3. Mixed hyperlipidemia At annual physical exam, patient was not fasting and lipid panel revealed elevated cholesterol and triglycerides.  Since patient is fasting today, will repeat lipid panel.  Will contact patient and discuss further treatment plan once results have returned. - Lipid panel   Meds ordered this encounter  Medications  . azithromycin (ZITHROMAX) 250 MG tablet    Sig: Take 2 tabs PO x 1 dose, then 1 tab PO QD x 4 days    Dispense:  6 tablet    Refill:  0    Order Specific Question:   Supervising Provider    Answer:   Ethelda ChickSMITH, KRISTI M [[id number]]  . ondansetron (ZOFRAN ODT) 4 MG disintegrating tablet    Sig: Take 1 tablet (4 mg total) by mouth every 8 (eight) hours as needed for nausea or vomiting.    Dispense:  20 tablet    Refill:  0    Order Specific Question:  Supervising Provider    Answer:   Nilda Simmer M [[id number]]   Benjiman Core, PA-C  Primary Care at [facility] 10/13/2017 12:48 PM

## 2017-10-14 LAB — LIPID PANEL
Chol/HDL Ratio: 8 ratio — ABNORMAL HIGH (ref 0.0–5.0)
Cholesterol, Total: 231 mg/dL — ABNORMAL HIGH (ref 100–199)
HDL: 29 mg/dL — AB (ref >39)
TRIGLYCERIDES: 598 mg/dL — AB (ref 0–149)

## 2017-10-15 LAB — CULTURE, GROUP A STREP: Strep A Culture: NEGATIVE

## 2017-10-20 ENCOUNTER — Other Ambulatory Visit: Payer: Self-pay | Admitting: Physician Assistant

## 2017-10-20 MED ORDER — ATORVASTATIN CALCIUM 20 MG PO TABS: 20.0000 mg | ORAL_TABLET | Freq: Every day | ORAL | 3 refills | Status: DC

## 2017-10-20 NOTE — Progress Notes (Signed)
Meds ordered this encounter  Medications  . atorvastatin (LIPITOR) 20 MG tablet    Sig: Take 1 tablet (20 mg total) by mouth daily.    Dispense:  90 tablet    Refill:  3    Order Specific Question:   Supervising Provider    Answer:   SMITH, KRISTI M [[id number]]    

## 2018-03-21 ENCOUNTER — Other Ambulatory Visit: Payer: Self-pay

## 2018-03-21 ENCOUNTER — Ambulatory Visit: Payer: 59 | Admitting: Physician Assistant

## 2018-03-21 ENCOUNTER — Encounter: Payer: Self-pay | Admitting: Physician Assistant

## 2018-03-21 VITALS — BP 133/80 | HR 92 | Temp 98.7°F | Resp 16 | Ht 69.0 in | Wt 266.0 lb

## 2018-03-21 DIAGNOSIS — Z8739 Personal history of other diseases of the musculoskeletal system and connective tissue: Secondary | ICD-10-CM | POA: Diagnosis not present

## 2018-03-21 DIAGNOSIS — M79671 Pain in right foot: Secondary | ICD-10-CM

## 2018-03-21 MED ORDER — COLCHICINE 0.6 MG PO TABS: ORAL_TABLET | ORAL | 0 refills | Status: DC

## 2018-03-21 MED ORDER — INDOMETHACIN 50 MG PO CAPS: 50.0000 mg | ORAL_CAPSULE | Freq: Two times a day (BID) | ORAL | 0 refills | Status: DC

## 2018-03-21 NOTE — Progress Notes (Signed)
03/21/2018 11:00 AM   DOB: [birth year] / MRN: [id number]  SUBJECTIVE:  Scott Payne is a 32 y.o. male presenting for atraumatic right foot pain, swelling, associated redness.  Patient has a history of gout.  Pain is only present when he is ambulating.  Last gout flare was 4 years ago.  Denies any recent shellfish alcohol consumption red meats.  He is allergic to penicillins.   He  has a past medical history of Allergy, Gout, and Hyperlipidemia.    He  reports that he has never smoked. He has never used smokeless tobacco. He reports that he does not drink alcohol or use drugs. He  reports that he currently engages in sexual activity and has had partners who are Male. The patient  has a past surgical history that includes Cosmetic surgery and Knee surgery (Right).  His family history includes Cancer in his father; Diabetes in his mother and sister; Heart disease in his mother; Hyperlipidemia in his mother; Hypertension in his mother.  Review of Systems  Constitutional: Negative for chills and fever.  Respiratory: Negative for cough and shortness of breath.   Cardiovascular: Negative for chest pain and leg swelling.  Musculoskeletal: Positive for joint pain. Negative for myalgias.  Skin: Positive for rash. Negative for itching.  Neurological: Negative for dizziness.    The problem list and medications were reviewed and updated by myself where necessary and exist elsewhere in the encounter.   OBJECTIVE:  BP 133/80   Pulse 92   Temp 98.7 F (37.1 C)   Resp 16   Ht 5\' 9"  (1.753 m)   Wt 266 lb (120.7 kg)   SpO2 99%   BMI 39.28 kg/m   Physical Exam  Constitutional: He is oriented to person, place, and time. He appears well-developed. He does not appear ill.  Eyes: Pupils are equal, round, and reactive to light. Conjunctivae and EOM are normal.  Cardiovascular: Normal rate.  Pulmonary/Chest: Effort normal.  Abdominal: He exhibits no distension.  Musculoskeletal: Normal range  of motion.       Feet:  Neurological: He is alert and oriented to person, place, and time. No cranial nerve deficit. Coordination normal.  Skin: Skin is warm and dry. He is not diaphoretic.  Psychiatric: He has a normal mood and affect.  Nursing note and vitals reviewed.   No results found for this or any previous visit (from the past 72 hour(s)).  No results found.  ASSESSMENT AND PLAN:  [name] was seen today for foot pain.  Diagnoses and all orders for this visit:  Right foot pain: No traumas.  No insult to the integument about the area.  Cellulitis seems doubtful.  Patient does have a history of gout and was on allopurinol at one time.  I am going to treat for gout.  I will see him back for imaging if he does not improve with treatment. -     colchicine 0.6 MG tablet; Take 2 tabs once and then repeat an hour later.  Repeat tomorrow. -     indomethacin (INDOCIN) 50 MG capsule; Take 1 capsule (50 mg total) by mouth 2 (two) times daily with a meal. Start this if the colchicine does not provide long-lasting relief.  History of gout    The patient is advised to call or return to clinic if he does not see an improvement in symptoms, or to seek the care of the closest emergency department if he worsens with the above plan.  Scott BostonMichael Kailiana Payne, MHS, PA-C Primary Care at [facility] 03/21/2018 11:00 AM

## 2018-03-21 NOTE — Patient Instructions (Addendum)
Start with the colchicine.  Was given that 48 hours before you make any decisions about picking up the indomethacin.  I do expect  your foot to improve with colchicine.  Come back if you not getting better with the medications are prescribed.    IF you received an x-ray today, you will receive an invoice from [facility]. Please contact [facility] at [phone] with questions or concerns regarding your invoice.   IF you received labwork today, you will receive an invoice from [facility]. Please contact [facility] at [phone] with questions or concerns regarding your invoice.   Our billing staff will not be able to assist you with questions regarding bills from these companies.  You will be contacted with the lab results as soon as they are available. The fastest way to get your results is to activate your My Chart account. Instructions are located on the last page of this paperwork. If you have not heard from us regarding the results in 2 weeks, please contact this office.

## 2018-04-15 ENCOUNTER — Ambulatory Visit: Payer: 59 | Admitting: Physician Assistant

## 2018-04-15 NOTE — Progress Notes (Deleted)
   Scott Payne  MRN: [id number] DOB: 06/19/1986  Subjective:  Scott Payne is a 32 y.o. male seen in office today for a chief complaint of rash involving the {body part:32401}. Rash started {1-10:13787} {time; units:19136} ago. Lesions are {color:5006}, and {text:16500} in texture. Rash {has/not:18111} changed over time. Rash {rash dc:16501}. Associated symptoms: {rash assoc:16502}. Patient denies: {rash assoc:19565}. Patient {has/not:18111} had contacts with similar rash. Patient {has/not:18111} had new exposures (soaps, lotions, laundry detergents, foods, medications, plants, insects or animals).  {Common ambulatory SmartLinks:19316}   Review of Systems  Patient Active Problem List   Diagnosis Date Noted  . Annual physical exam 09/14/2016  . Obesity 09/14/2016  . Hyperlipidemia 09/14/2016  . Acute gout 09/19/2015  . Hyperuricemia 09/19/2015    Current Outpatient Medications on File Prior to Visit  Medication Sig Dispense Refill  . colchicine 0.6 MG tablet Take 2 tabs once and then repeat an hour later.  Repeat tomorrow. 6 tablet 0  . indomethacin (INDOCIN) 50 MG capsule Take 1 capsule (50 mg total) by mouth 2 (two) times daily with a meal. Start this if the colchicine does not provide long-lasting relief. 30 capsule 0   No current facility-administered medications on file prior to visit.     Allergies  Allergen Reactions  . Penicillins Itching and Rash     Objective:  There were no vitals taken for this visit.  Physical Exam  Assessment and Plan :  *** There are no diagnoses linked to this encounter.   Benjiman CoreBrittany Wiseman PA-C  Primary Care at [facility] 04/15/2018 7:56 AM

## 2018-07-01 ENCOUNTER — Encounter: Payer: Self-pay | Admitting: Emergency Medicine

## 2018-07-01 ENCOUNTER — Ambulatory Visit: Payer: 59 | Admitting: Emergency Medicine

## 2018-07-01 ENCOUNTER — Other Ambulatory Visit: Payer: Self-pay

## 2018-07-01 VITALS — BP 113/70 | HR 95 | Temp 98.8°F | Resp 16 | Ht 67.5 in | Wt 262.4 lb

## 2018-07-01 DIAGNOSIS — R05 Cough: Secondary | ICD-10-CM

## 2018-07-01 DIAGNOSIS — J22 Unspecified acute lower respiratory infection: Secondary | ICD-10-CM

## 2018-07-01 DIAGNOSIS — R059 Cough, unspecified: Secondary | ICD-10-CM

## 2018-07-01 MED ORDER — AZITHROMYCIN 250 MG PO TABS: ORAL_TABLET | ORAL | 0 refills | Status: DC

## 2018-07-01 MED ORDER — PROMETHAZINE-CODEINE 6.25-10 MG/5ML PO SYRP: 5.0000 mL | ORAL_SOLUTION | Freq: Every evening | ORAL | 0 refills | Status: DC | PRN

## 2018-07-01 MED ORDER — PSEUDOEPHEDRINE-GUAIFENESIN ER 60-600 MG PO TB12: 1.0000 | ORAL_TABLET | Freq: Two times a day (BID) | ORAL | 1 refills | Status: AC

## 2018-07-01 NOTE — Progress Notes (Signed)
Scott Payne 32 y.o.   Chief Complaint  Patient presents with  . Cough    with yellow mucus started 06/30/2018  . Sore Throat    with runny nose    HISTORY OF PRESENT ILLNESS: This is a 32 y.o. male complaining of productive cough with sore throat and chest congestion started 2 days ago. No other significant symptoms.  Cough  This is a new problem. The current episode started in the past 7 days. The problem has been gradually worsening. The problem occurs every few minutes. The cough is productive of sputum. Associated symptoms include headaches, nasal congestion and a sore throat. Pertinent negatives include no chest pain, chills, ear congestion, ear pain, eye redness, fever, heartburn, hemoptysis, myalgias, rash, rhinorrhea, shortness of breath, sweats or wheezing. Nothing aggravates the symptoms. Risk factors: none. There is no history of asthma, bronchitis, COPD, emphysema or pneumonia.     Prior to Admission medications   Medication Sig Start Date End Date Taking? Authorizing Provider  colchicine 0.6 MG tablet Take 2 tabs once and then repeat an hour later.  Repeat tomorrow. Patient not taking: Reported on 07/01/2018 03/21/18   Ofilia Neaslark, Michael L, PA-C  indomethacin (INDOCIN) 50 MG capsule Take 1 capsule (50 mg total) by mouth 2 (two) times daily with a meal. Start this if the colchicine does not provide long-lasting relief. Patient not taking: Reported on 07/01/2018 03/21/18   Ofilia Neaslark, Michael L, PA-C    Allergies  Allergen Reactions  . Penicillins Itching and Rash    Patient Active Problem List   Diagnosis Date Noted  . Annual physical exam 09/14/2016  . Obesity 09/14/2016  . Hyperlipidemia 09/14/2016  . Acute gout 09/19/2015  . Hyperuricemia 09/19/2015    Past Medical History:  Diagnosis Date  . Allergy   . Gout   . Hyperlipidemia     Past Surgical History:  Procedure Laterality Date  . COSMETIC SURGERY    . KNEE SURGERY Right    2007    Social History    Socioeconomic History  . Marital status: Single    Spouse name: Not on file  . Number of children: 0  . Years of education: Not on file  . Highest education level: Not on file  Occupational History  . Occupation: Brewing technologistndustrial Air Conditioning     Employer: [employer]  Social Needs  . Financial resource strain: Not hard at all  . Food insecurity:    Worry: Never true    Inability: Never true  . Transportation needs:    Medical: No    Non-medical: No  Tobacco Use  . Smoking status: Never Smoker  . Smokeless tobacco: Never Used  Substance and Sexual Activity  . Alcohol use: No  . Drug use: No  . Sexual activity: Yes    Partners: Female    Comment: with monogamous partner  Lifestyle  . Physical activity:    Days per week: 2 days    Minutes per session: 30 min  . Stress: Not at all  Relationships  . Social connections:    Talks on phone: More than three times a week    Gets together: Three times a week    Attends religious service: More than 4 times per year    Active member of club or organization: No    Attends meetings of clubs or organizations: Never    Relationship status: Never married  . Intimate partner violence:    Fear of current or ex partner: No  Emotionally abused: No    Physically abused: No    Forced sexual activity: No  Other Topics Concern  . Not on file  Social History Narrative   Pt is from [place]. He currently lives with mom and dad.      Diet: Typically eats protein bar for breakfast, sub sandwich for lunch, and fast food for dinner. Drinks a lot of water.       Exercise: Does structured exercise 2-3 days a week for 30 minutes at time. Typically does a mix of cardio and strength training.     Family History  Problem Relation Age of Onset  . Diabetes Mother   . Heart disease Mother   . Hyperlipidemia Mother   . Hypertension Mother   . Cancer Father   . Diabetes Sister      Review of Systems  Constitutional: Negative for  chills and fever.  HENT: Positive for sore throat. Negative for ear pain and rhinorrhea.   Eyes: Negative.  Negative for discharge and redness.  Respiratory: Positive for cough and sputum production. Negative for hemoptysis, shortness of breath and wheezing.   Cardiovascular: Negative.  Negative for chest pain and palpitations.  Gastrointestinal: Negative.  Negative for abdominal pain, blood in stool, diarrhea, heartburn, nausea and vomiting.  Genitourinary: Negative.  Negative for dysuria and hematuria.  Musculoskeletal: Negative for back pain, myalgias and neck pain.  Skin: Negative for rash.  Neurological: Positive for headaches. Negative for seizures, loss of consciousness and weakness.  Endo/Heme/Allergies: Negative.   All other systems reviewed and are negative.   Vitals:   07/01/18 1142  BP: 113/70  Pulse: 95  Resp: 16  Temp: 98.8 F (37.1 C)  SpO2: 98%    Physical Exam  Constitutional: He is oriented to person, place, and time. He appears well-developed and well-nourished.  HENT:  Head: Normocephalic and atraumatic.  Nose: Nose normal.  Mouth/Throat: Oropharynx is clear and moist.  Eyes: Pupils are equal, round, and reactive to light. Conjunctivae and EOM are normal.  Neck: Normal range of motion. Neck supple. No JVD present. No thyromegaly present.  Cardiovascular: Normal rate, regular rhythm and normal heart sounds.  Pulmonary/Chest: Effort normal and breath sounds normal.  Abdominal: Soft. Bowel sounds are normal. He exhibits no distension. There is no tenderness.  Musculoskeletal: Normal range of motion. He exhibits no edema or tenderness.  Lymphadenopathy:    He has no cervical adenopathy.  Neurological: He is alert and oriented to person, place, and time. No sensory deficit. He exhibits normal muscle tone.  Skin: Skin is warm and dry. Capillary refill takes less than 2 seconds. No rash noted.  Psychiatric: He has a normal mood and affect. His behavior is normal.   Vitals reviewed.    ASSESSMENT & PLAN: Scott Payne was seen today for cough and sore throat.  Diagnoses and all orders for this visit:  Cough -     pseudoephedrine-guaifenesin (MUCINEX D) 60-600 MG 12 hr tablet; Take 1 tablet by mouth every 12 (twelve) hours for 5 days. -     promethazine-codeine (PHENERGAN WITH CODEINE) 6.25-10 MG/5ML syrup; Take 5 mLs by mouth at bedtime as needed for cough.  Lower resp. tract infection -     azithromycin (ZITHROMAX) 250 MG tablet; Sig as indicated    Patient Instructions       If you have lab work done today you will be contacted with your lab results within the next 2 weeks.  If you have not heard from [place] then  please contact us. The fastest way to get your results is to register for My Chart.   IF you received an x-ray today, you will receive an invoice from [facility]. Please contact [facility] at [phone] with questions or concerns regarding your invoice.   IF you received labwork today, you will receive an invoice from [facility]. Please contact [facility] at [phone] with questions or concerns regarding your invoice.   Our billing staff will not be able to assist you with questions regarding bills from these companies.  You will be contacted with the lab results as soon as they are available. The fastest way to get your results is to activate your My Chart account. Instructions are located on the last page of this paperwork. If you have not heard from [place] regarding the results in 2 weeks, please contact this office.     Cough, Adult A cough helps to clear your throat and lungs. A cough may last only 2-3 weeks (acute), or it may last longer than 8 weeks (chronic). Many different things can cause a cough. A cough may be a sign of an illness or another medical condition. Follow these instructions at home:  Pay attention to any changes in your cough.  Take medicines only as told by your doctor. ? If you were  prescribed an antibiotic medicine, take it as told by your doctor. Do not stop taking it even if you start to feel better. ? Talk with your doctor before you try using a cough medicine.  Drink enough fluid to keep your pee (urine) clear or pale yellow.  If the air is dry, use a cold steam vaporizer or humidifier in your home.  Stay away from things that make you cough at work or at home.  If your cough is worse at night, try using extra pillows to raise your head up higher while you sleep.  Do not smoke, and try not to be around smoke. If you need help quitting, ask your doctor.  Do not have caffeine.  Do not drink alcohol.  Rest as needed. Contact a doctor if:  You have new problems (symptoms).  You cough up yellow fluid (pus).  Your cough does not get better after 2-3 weeks, or your cough gets worse.  Medicine does not help your cough and you are not sleeping well.  You have pain that gets worse or pain that is not helped with medicine.  You have a fever.  You are losing weight and you do not know why.  You have night sweats. Get help right away if:  You cough up blood.  You have trouble breathing.  Your heartbeat is very fast. This information is not intended to replace advice given to you by your health care provider. Make sure you discuss any questions you have with your health care provider. Document Released: 06/18/2011 Document Revised: 03/12/2016 Document Reviewed: 12/12/2014 Elsevier Interactive Patient Education  2018 Elsevier Inc.      Edwina Barth, MD [facility]

## 2018-07-01 NOTE — Patient Instructions (Addendum)
     If you have lab work done today you will be contacted with your lab results within the next 2 weeks.  If you have not heard from us then please contact us. The fastest way to get your results is to register for My Chart.   IF you received an x-ray today, you will receive an invoice from [facility]. Please contact [facility] at [phone] with questions or concerns regarding your invoice.   IF you received labwork today, you will receive an invoice from [facility]. Please contact [facility] at [phone] with questions or concerns regarding your invoice.   Our billing staff will not be able to assist you with questions regarding bills from these companies.  You will be contacted with the lab results as soon as they are available. The fastest way to get your results is to activate your My Chart account. Instructions are located on the last page of this paperwork. If you have not heard from us regarding the results in 2 weeks, please contact this office.     Cough, Adult A cough helps to clear your throat and lungs. A cough may last only 2-3 weeks (acute), or it may last longer than 8 weeks (chronic). Many different things can cause a cough. A cough may be a sign of an illness or another medical condition. Follow these instructions at home:  Pay attention to any changes in your cough.  Take medicines only as told by your doctor. ? If you were prescribed an antibiotic medicine, take it as told by your doctor. Do not stop taking it even if you start to feel better. ? Talk with your doctor before you try using a cough medicine.  Drink enough fluid to keep your pee (urine) clear or pale yellow.  If the air is dry, use a cold steam vaporizer or humidifier in your home.  Stay away from things that make you cough at work or at home.  If your cough is worse at night, try using extra pillows to raise your head up higher while you sleep.  Do not smoke, and try not to be  around smoke. If you need help quitting, ask your doctor.  Do not have caffeine.  Do not drink alcohol.  Rest as needed. Contact a doctor if:  You have new problems (symptoms).  You cough up yellow fluid (pus).  Your cough does not get better after 2-3 weeks, or your cough gets worse.  Medicine does not help your cough and you are not sleeping well.  You have pain that gets worse or pain that is not helped with medicine.  You have a fever.  You are losing weight and you do not know why.  You have night sweats. Get help right away if:  You cough up blood.  You have trouble breathing.  Your heartbeat is very fast. This information is not intended to replace advice given to you by your health care provider. Make sure you discuss any questions you have with your health care provider. Document Released: 06/18/2011 Document Revised: 03/12/2016 Document Reviewed: 12/12/2014 Elsevier Interactive Patient Education  2018 Elsevier Inc.  

## 2018-08-26 ENCOUNTER — Encounter: Payer: Self-pay | Admitting: Physician Assistant

## 2018-08-26 ENCOUNTER — Other Ambulatory Visit: Payer: Self-pay

## 2018-08-26 ENCOUNTER — Ambulatory Visit (INDEPENDENT_AMBULATORY_CARE_PROVIDER_SITE_OTHER): Payer: 59 | Admitting: Physician Assistant

## 2018-08-26 VITALS — BP 108/72 | Temp 99.0°F | Resp 16 | Ht 67.0 in | Wt 263.6 lb

## 2018-08-26 DIAGNOSIS — J029 Acute pharyngitis, unspecified: Secondary | ICD-10-CM | POA: Diagnosis not present

## 2018-08-26 LAB — POCT RAPID STREP A (OFFICE): Rapid Strep A Screen: NEGATIVE

## 2018-08-26 NOTE — Progress Notes (Signed)
Scott Payne  MRN: [id number] DOB: Dec 27, 1985  PCP: Magdalene River, PA-C  Subjective:  Pt is a 32 year old male who presents to clinic for sore thrat x 3 days.  3/10 pain b/l.  Worse in the morning.  Pain with swallowing. Endorses mild cough.  he's taken mucinex.  Denies fever, chills, chills, diaphoresis, body aches, drooling, difficulty breathing or swallowing.   Review of Systems  Constitutional: Negative for chills, diaphoresis, fatigue and fever.  HENT: Positive for sore throat and trouble swallowing. Negative for congestion, postnasal drip, rhinorrhea, sinus pressure, sinus pain and sneezing.   Respiratory: Negative for cough, shortness of breath and wheezing.     Patient Active Problem List   Diagnosis Date Noted  . Cough 07/01/2018  . Lower resp. tract infection 07/01/2018  . Obesity 09/14/2016  . Hyperlipidemia 09/14/2016  . Hyperuricemia 09/19/2015    Current Outpatient Medications on File Prior to Visit  Medication Sig Dispense Refill  . colchicine 0.6 MG tablet Take 2 tabs once and then repeat an hour later.  Repeat tomorrow. (Patient not taking: Reported on 07/01/2018) 6 tablet 0  . indomethacin (INDOCIN) 50 MG capsule Take 1 capsule (50 mg total) by mouth 2 (two) times daily with a meal. Start this if the colchicine does not provide long-lasting relief. (Patient not taking: Reported on 07/01/2018) 30 capsule 0  . promethazine-codeine (PHENERGAN WITH CODEINE) 6.25-10 MG/5ML syrup Take 5 mLs by mouth at bedtime as needed for cough. (Patient not taking: Reported on 08/26/2018) 120 mL 0   No current facility-administered medications on file prior to visit.     Allergies  Allergen Reactions  . Penicillins Itching and Rash     Objective:  BP 108/72 (BP Location: Left Arm, Patient Position: Sitting, Cuff Size: Large)   Temp 99 F (37.2 C)   Resp 16   Ht 5\' 7"  (1.702 m)   Wt 263 lb 9.6 oz (119.6 kg)   SpO2 95%   BMI 41.29 kg/m   Physical Exam    Constitutional: He is oriented to person, place, and time. No distress.  HENT:  Right Ear: Tympanic membrane normal.  Left Ear: Tympanic membrane normal.  Nose: Mucosal edema present. No rhinorrhea. Right sinus exhibits no maxillary sinus tenderness and no frontal sinus tenderness. Left sinus exhibits no maxillary sinus tenderness and no frontal sinus tenderness.  Mouth/Throat: Uvula is midline and mucous membranes are normal. Posterior oropharyngeal edema present. No oropharyngeal exudate, posterior oropharyngeal erythema or tonsillar abscesses.  Cardiovascular: Normal rate, regular rhythm and normal heart sounds.  Pulmonary/Chest: Effort normal and breath sounds normal. No respiratory distress. He has no wheezes. He has no rales.  Neurological: He is alert and oriented to person, place, and time.  Skin: Skin is warm and dry.  Psychiatric: Judgment normal.  Vitals reviewed.   Results for orders placed or performed in visit on 08/26/18  POCT rapid strep A  Result Value Ref Range   Rapid Strep A Screen Negative Negative    Assessment and Plan :  1. Sore throat - Pt presents c/o sore throat x 3 days. Vitals are stable. Rapid strep negative. Will await culture to determine treatment if needed. Home care tips printed out for pt. RTC if no improvement.  - POCT rapid strep A - Culture, Group A Strep   Marco Collie, PA-C  Primary Care at [facility] 08/26/2018 4:11 PM  Please note: Portions of this report may have been transcribed using dragon  voice recognition software. Every effort was made to ensure accuracy; however, inadvertent computerized transcription errors may be present.

## 2018-08-26 NOTE — Patient Instructions (Addendum)
  Your rapid strep test is negative. (this test is about 85% accurate) I am sending a swab to the lab to see if bacteria grow in a culture. I will call you if you need to start antibiotics.  This could also be the flu. Please take precautions if you are around the young, old or ill.   Stay well hydrated. Get lost of rest. Wash your hands often.  Go buy Cepacol throat lozenges from your pharmacy.  For sore throat try using a honey-based tea. Use 3 teaspoons of honey with juice squeezed from half lemon. Place shaved pieces of ginger into 1/2-1 cup of water and warm over stove top. Then mix the ingredients and repeat every 4 hours as needed.  Gargle with 8 oz of salt water ( tsp of salt per 1 qt of water) as often as every 1-2 hours to soothe your throat.   -Foods that can help speed recovery: honey, garlic, chicken soup, elderberries, green tea.  -Supplements that can help speed recovery: vitamin C, zinc, elderberry extract, quercetin, ginseng, selenium  Advil or ibuprofen for pain. Do not take Aspirin.  Drink enough water and fluids to keep your urine clear or pale yellow.  Come back if you get worse.   Thank you for coming in today. I hope you feel we met your needs. Feel free to call PCP if you have any questions or further requests. Please consider signing up for MyChart if you do not already have it, as this is a great way to communicate with me.  Best,  [employer], PA-C

## 2018-08-27 ENCOUNTER — Ambulatory Visit: Payer: 59 | Admitting: Family Medicine

## 2018-08-28 LAB — CULTURE, GROUP A STREP: Strep A Culture: NEGATIVE

## 2019-04-28 ENCOUNTER — Encounter: Payer: Self-pay | Admitting: Family Medicine

## 2019-04-28 ENCOUNTER — Ambulatory Visit: Payer: 59 | Admitting: Family Medicine

## 2019-04-28 ENCOUNTER — Other Ambulatory Visit: Payer: Self-pay

## 2019-04-28 VITALS — BP 120/81 | HR 100 | Temp 98.4°F | Resp 18 | Ht 69.0 in | Wt 270.2 lb

## 2019-04-28 DIAGNOSIS — M79672 Pain in left foot: Secondary | ICD-10-CM

## 2019-04-28 DIAGNOSIS — Z8739 Personal history of other diseases of the musculoskeletal system and connective tissue: Secondary | ICD-10-CM | POA: Diagnosis not present

## 2019-04-28 MED ORDER — INDOMETHACIN 50 MG PO CAPS: 50.0000 mg | ORAL_CAPSULE | Freq: Two times a day (BID) | ORAL | 0 refills | Status: DC

## 2019-04-28 MED ORDER — COLCHICINE 0.6 MG PO TABS: ORAL_TABLET | ORAL | 0 refills | Status: DC

## 2019-04-28 NOTE — Progress Notes (Signed)
Subjective:    Patient ID: Scott Payne, male    DOB: 10/04/1986, 33 y.o.   MRN: [id number]  HPI Scott Payne is a 33 y.o. male Presents today for: Chief Complaint  Patient presents with  . Foot Pain    L, possible gout, started 07/08, no other sx, advil was taken   Gout flare: Pain initially in left ankle 2 mornings ago. NKI, swelling, red.  Moved into top of foot middle of yesterday.  No known change in diet prior, no change in protein intake or alcohol.  Gradually improving past 24-48 hours.  Hurt to touch initally.  In past usually in R ankle, lasts 1-2 days. Treated with cochicine. Out of that med.  (Last flare few years ago) No fever.   Tx: advil 400mg  yesterday and today - last dose this am.   Lab Results  Component Value Date   LABURIC 7.9 09/14/2016     Patient Active Problem List   Diagnosis Date Noted  . Cough 07/01/2018  . Lower resp. tract infection 07/01/2018  . Obesity 09/14/2016  . Hyperlipidemia 09/14/2016  . Hyperuricemia 09/19/2015   Past Medical History:  Diagnosis Date  . Allergy   . Gout   . Hyperlipidemia    Past Surgical History:  Procedure Laterality Date  . COSMETIC SURGERY    . KNEE SURGERY Right    2007   Allergies  Allergen Reactions  . Penicillins Itching and Rash   Prior to Admission medications   Medication Sig Start Date End Date Taking? Authorizing Provider  colchicine 0.6 MG tablet Take 2 tabs once and then repeat an hour later.  Repeat tomorrow. Patient not taking: Reported on 07/01/2018 03/21/18   Ofilia Neaslark, Michael L, PA-C  indomethacin (INDOCIN) 50 MG capsule Take 1 capsule (50 mg total) by mouth 2 (two) times daily with a meal. Start this if the colchicine does not provide long-lasting relief. Patient not taking: Reported on 07/01/2018 03/21/18   Ofilia Neaslark, Michael L, PA-C  promethazine-codeine [facility](PHENERGAN WITH CODEINE) 6.25-10 MG/5ML syrup Take 5 mLs by mouth at bedtime as needed for cough. Patient not taking: Reported on  08/26/2018 07/01/18   Georgina QuintSagardia, Miguel Jose, MD   Social History   Socioeconomic History  . Marital status: Single    Spouse name: Not on file  . Number of children: 0  . Years of education: Not on file  . Highest education level: Not on file  Occupational History  . Occupation: Brewing technologistndustrial Air Conditioning     Employer: [employer]  Social Needs  . Financial resource strain: Not hard at all  . Food insecurity    Worry: Never true    Inability: Never true  . Transportation needs    Medical: No    Non-medical: No  Tobacco Use  . Smoking status: Never Smoker  . Smokeless tobacco: Never Used  Substance and Sexual Activity  . Alcohol use: No  . Drug use: No  . Sexual activity: Yes    Partners: Female    Comment: with monogamous partner  Lifestyle  . Physical activity    Days per week: 2 days    Minutes per session: 30 min  . Stress: Not at all  Relationships  . Social connections    Talks on phone: More than three times a week    Gets together: Three times a week    Attends religious service: More than 4 times per year    Active member of club or organization:  No    Attends meetings of clubs or organizations: Never    Relationship status: Never married  . Intimate partner violence    Fear of current or ex partner: No    Emotionally abused: No    Physically abused: No    Forced sexual activity: No  Other Topics Concern  . Not on file  Social History Narrative   Pt is from [place]. He currently lives with mom and dad.      Diet: Typically eats protein bar for breakfast, sub sandwich for lunch, and fast food for dinner. Drinks a lot of water.       Exercise: Does structured exercise 2-3 days a week for 30 minutes at time. Typically does a mix of cardio and strength training.      Review of Systems Per HPI.     Objective:   Physical Exam Constitutional:      General: He is not in acute distress.    Appearance: He is well-developed.  HENT:     Head:  Normocephalic and atraumatic.  Cardiovascular:     Rate and Rhythm: Normal rate.  Pulmonary:     Effort: Pulmonary effort is normal.  Musculoskeletal:       Feet:  Neurological:     Mental Status: He is alert and oriented to person, place, and time.    Vitals:   04/28/19 1610  BP: 120/81  Pulse: 100  Resp: 18  Temp: 98.4 F (36.9 C)  SpO2: 97%  Weight: 270 lb 3.2 oz (122.6 kg)  Height: 5\' 9"  (1.753 m)        Assessment & Plan:   Scott Payne is a 33 y.o. male History of gout  Left foot pain - Plan: colchicine 0.6 MG tablet, indomethacin (INDOCIN) 50 MG capsule Appears consistent with gout flare.  Some improvement.   -Start indomethacin 50 mg twice daily with food, avoid other NSAIDs.  If not continue to improve, RTC precautions for possible prednisone.  -Colchicine for next flare but if recurrence of gout within the next 6 months would repeat uric acid and consider daily allopurinol  -Physical in the next 6 months and can check uric acid level at that time as well.  Meds ordered this encounter  Medications  . colchicine 0.6 MG tablet    Sig: Take 2 tabs once and then 1 tab if needed an hour later for gout flare.  Up to 1 pill twice per day if needed on day 2.    Dispense:  6 tablet    Refill:  0  . indomethacin (INDOCIN) 50 MG capsule    Sig: Take 1 capsule (50 mg total) by mouth 2 (two) times daily with a meal. As needed.    Dispense:  30 capsule    Refill:  0   Patient Instructions    Current foot pain does appear to be a gout flare, but is improving.  Okay to take indomethacin twice per day with food, do not combine with ibuprofen.  As long as symptoms are continuing to improve, indomethacin should be sufficient.  If not improving within the next 1 week, or worsening sooner, return for recheck.  If you have another flare of gout, take 2 of the colchicine initially with 1 pill an hour later if needed.  If medicine needed the second day, can take 1 pill  twice per day until symptoms have resolved.  Follow-up in 6 months for physical and can check uric acid or gout  test at that time.  If you have a flare before that time, I would consider a daily medication to prevent gout.   Return to the clinic or go to the nearest emergency room if any of your symptoms worsen or new symptoms occur.   If you have lab work done today you will be contacted with your lab results within the next 2 weeks.  If you have not heard from [place] then please contact us. The fastest way to get your results is to register for My Chart.   IF you received an x-ray today, you will receive an invoice from [facility]. Please contact [facility] at [phone] with questions or concerns regarding your invoice.   IF you received labwork today, you will receive an invoice from [place] [facility]. Please contact [facility] at [phone] with questions or concerns regarding your invoice.   Our billing staff will not be able to assist you with questions regarding bills from these companies.  You will be contacted with the lab results as soon as they are available. The fastest way to get your results is to activate your My Chart account. Instructions are located on the last page of this paperwork. If you have not heard from [place] regarding the results in 2 weeks, please contact this office.       Signed,   Merri Ray, MD Primary Care at [facility].  04/28/19 6:31 PM

## 2019-04-28 NOTE — Patient Instructions (Addendum)
  Current foot pain does appear to be a gout flare, but is improving.  Okay to take indomethacin twice per day with food, do not combine with ibuprofen.  As long as symptoms are continuing to improve, indomethacin should be sufficient.  If not improving within the next 1 week, or worsening sooner, return for recheck.  If you have another flare of gout, take 2 of the colchicine initially with 1 pill an hour later if needed.  If medicine needed the second day, can take 1 pill twice per day until symptoms have resolved.  Follow-up in 6 months for physical and can check uric acid or gout test at that time.  If you have a flare before that time, I would consider a daily medication to prevent gout.   Return to the clinic or go to the nearest emergency room if any of your symptoms worsen or new symptoms occur.   If you have lab work done today you will be contacted with your lab results within the next 2 weeks.  If you have not heard from [place] then please contact us. The fastest way to get your results is to register for My Chart.   IF you received an x-ray today, you will receive an invoice from [facility]. Please contact [facility] at [phone] with questions or concerns regarding your invoice.   IF you received labwork today, you will receive an invoice from [place]. Please contact [facility] at [phone] with questions or concerns regarding your invoice.   Our billing staff will not be able to assist you with questions regarding bills from these companies.  You will be contacted with the lab results as soon as they are available. The fastest way to get your results is to activate your My Chart account. Instructions are located on the last page of this paperwork. If you have not heard from [place] regarding the results in 2 weeks, please contact this office.

## 2019-09-15 ENCOUNTER — Ambulatory Visit (INDEPENDENT_AMBULATORY_CARE_PROVIDER_SITE_OTHER): Payer: 59 | Admitting: Family Medicine

## 2019-09-15 ENCOUNTER — Encounter: Payer: Self-pay | Admitting: Family Medicine

## 2019-09-15 ENCOUNTER — Other Ambulatory Visit: Payer: Self-pay

## 2019-09-15 VITALS — BP 124/82 | HR 77 | Temp 98.6°F | Wt 271.6 lb

## 2019-09-15 DIAGNOSIS — Z23 Encounter for immunization: Secondary | ICD-10-CM

## 2019-09-15 DIAGNOSIS — Z0001 Encounter for general adult medical examination with abnormal findings: Secondary | ICD-10-CM

## 2019-09-15 DIAGNOSIS — E782 Mixed hyperlipidemia: Secondary | ICD-10-CM

## 2019-09-15 DIAGNOSIS — R21 Rash and other nonspecific skin eruption: Secondary | ICD-10-CM | POA: Diagnosis not present

## 2019-09-15 DIAGNOSIS — Z8739 Personal history of other diseases of the musculoskeletal system and connective tissue: Secondary | ICD-10-CM

## 2019-09-15 DIAGNOSIS — Z131 Encounter for screening for diabetes mellitus: Secondary | ICD-10-CM

## 2019-09-15 DIAGNOSIS — Z0000 Encounter for general adult medical examination without abnormal findings: Secondary | ICD-10-CM

## 2019-09-15 MED ORDER — TRIAMCINOLONE ACETONIDE 0.1 % EX CREA: 1.0000 "application " | TOPICAL_CREAM | Freq: Two times a day (BID) | CUTANEOUS | 0 refills | Status: AC

## 2019-09-15 NOTE — Progress Notes (Signed)
Subjective:  Patient ID: Scott Payne, male    DOB: January 16, 1986  Age: 33 y.o. MRN: [id number]  CC:  Chief Complaint  Patient presents with  . Annual Exam    annual exam with a work form that need to be signed by the doctor    HPI Scott Payne presents for  Annual physical exam. No specific concerns.   Gout: Last flare: Treated for flare July 10 with indomethacin, colchicine if needed for future flares. No recent flare. Has indomethacin - has not filled colchicine.  Daily meds:none Prn med: as above.  Careful with diet.   Lab Results  Component Value Date   LABURIC 7.9 09/14/2016   Hyperlipidemia: Significant elevated readings in December 2018, prescription for atorvastatin 20 mg daily given at that time with plan for 20-month repeat testing. Has not had repeat testing.  Took lipitor for awhile - about a month, then forgot to take/intermittnet use.  Mom on chol meds, not sure about dad.  No n/v/abd pain, no history of pancreatitis.  Lab Results  Component Value Date   CHOL 231 (H) 10/13/2017   HDL 29 (L) 10/13/2017   LDLCALC Comment 10/13/2017   TRIG 598 (HH) 10/13/2017   CHOLHDL 8.0 (H) 10/13/2017   Lab Results  Component Value Date   ALT 32 09/21/2017   AST 21 09/21/2017   ALKPHOS 67 09/21/2017   BILITOT 0.5 09/21/2017   Obesity: Wt Readings from Last 3 Encounters:  09/15/19 271 lb 9.6 oz (123.2 kg)  04/28/19 270 lb 3.2 oz (122.6 kg)  08/26/18 263 lb 9.6 oz (119.6 kg)  Body mass index is 40.11 kg/m. No regular exercise, sheet metal work - physical  Fast food: 4 times per week.  Soda/sweet tea: 2-3 glasses per day. Water at work.  had been to 205 with exercise 4 yrs ago.   Left leg rash: For years. Treated for psoriasis/eczema in past. No change with prior treatments. Using otc med - keeps at bay/moitened. Less itching.  No other locations.   Immunization History  Administered Date(s) Administered  . Influenza,inj,Quad PF,6+ Mos 10/06/2015,  09/14/2016, 09/21/2017  . Tdap 09/21/2017  flu vaccine today   STI screening: not sexually active at present.   Depression screen [facility] 2/9 09/15/2019 04/28/2019 08/26/2018 07/01/2018 03/21/2018  Decreased Interest 0 0 0 0 0  Down, Depressed, Hopeless 0 0 0 0 0  PHQ - 2 Score 0 0 0 0 0    Hearing Screening   125Hz  250Hz  500Hz  1000Hz  2000Hz  3000Hz  4000Hz  6000Hz  8000Hz   Right ear:           Left ear:             Visual Acuity Screening   Right eye Left eye Both eyes  Without correction: 20/25 20/20 20/20   With correction:      Dental: No regular care. Has dentist near house.   Exercise:  As above.     History Patient Active Problem List   Diagnosis Date Noted  . Cough 07/01/2018  . Lower resp. tract infection 07/01/2018  . Obesity 09/14/2016  . Hyperlipidemia 09/14/2016  . Hyperuricemia 09/19/2015   Past Medical History:  Diagnosis Date  . Allergy   . Gout   . Hyperlipidemia    Past Surgical History:  Procedure Laterality Date  . COSMETIC SURGERY    . KNEE SURGERY Right    2007   Allergies  Allergen Reactions  . Penicillins Itching and Rash   Prior to Admission  medications   Not on File   Social History   Socioeconomic History  . Marital status: Single    Spouse name: Not on file  . Number of children: 0  . Years of education: Not on file  . Highest education level: Not on file  Occupational History  . Occupation: Brewing technologist: [employer]  Social Needs  . Financial resource strain: Not hard at all  . Food insecurity    Worry: Never true    Inability: Never true  . Transportation needs    Medical: No    Non-medical: No  Tobacco Use  . Smoking status: Never Smoker  . Smokeless tobacco: Never Used  Substance and Sexual Activity  . Alcohol use: No  . Drug use: No  . Sexual activity: Yes    Partners: Female    Comment: with monogamous partner  Lifestyle  . Physical activity    Days per week: 2 days    Minutes  per session: 30 min  . Stress: Not at all  Relationships  . Social connections    Talks on phone: More than three times a week    Gets together: Three times a week    Attends religious service: More than 4 times per year    Active member of club or organization: No    Attends meetings of clubs or organizations: Never    Relationship status: Never married  . Intimate partner violence    Fear of current or ex partner: No    Emotionally abused: No    Physically abused: No    Forced sexual activity: No  Other Topics Concern  . Not on file  Social History Narrative   Pt is from [place]. He currently lives with mom and dad.      Diet: Typically eats protein bar for breakfast, sub sandwich for lunch, and fast food for dinner. Drinks a lot of water.       Exercise: Does structured exercise 2-3 days a week for 30 minutes at time. Typically does a mix of cardio and strength training.     Review of Systems  13 point review of systems per patient health survey noted.  Negative other than as indicated above or in HPI.   Objective:   Vitals:   09/15/19 1337  BP: 124/82  Pulse: 77  Temp: 98.6 F (37 C)  TempSrc: Oral  SpO2: 97%  Weight: 271 lb 9.6 oz (123.2 kg)     Physical Exam Vitals signs reviewed.  Constitutional:      Appearance: He is well-developed. He is obese.  HENT:     Head: Normocephalic and atraumatic.     Right Ear: External ear normal.     Left Ear: External ear normal.  Eyes:     Conjunctiva/sclera: Conjunctivae normal.     Pupils: Pupils are equal, round, and reactive to light.  Neck:     Musculoskeletal: Normal range of motion and neck supple.     Thyroid: No thyromegaly.  Cardiovascular:     Rate and Rhythm: Normal rate and regular rhythm.     Heart sounds: Normal heart sounds.  Pulmonary:     Effort: Pulmonary effort is normal. No respiratory distress.     Breath sounds: Normal breath sounds. No wheezing.  Abdominal:     General: There is no  distension.     Palpations: Abdomen is soft.     Tenderness: There is no abdominal tenderness.  Musculoskeletal: Normal range of motion.        General: No tenderness.  Lymphadenopathy:     Cervical: No cervical adenopathy.  Skin:    General: Skin is warm and dry.       Neurological:     Mental Status: He is alert and oriented to person, place, and time.     Deep Tendon Reflexes: Reflexes are normal and symmetric.  Psychiatric:        Behavior: Behavior normal.      Assessment & Plan:  Scott Payne is a 33 y.o. male . Annual physical exam  - .-anticipatory guidance as below in AVS, screening labs above. Health maintenance items as above in HPI discussed/recommended as applicable.   Need for prophylactic vaccination and inoculation against influenza - Plan: Flu Vaccine QUAD 6+ mos PF IM (Fluarix Quad PF)  Rash - Plan: triamcinolone cream (KENALOG) 0.1 %, Ambulatory referral to Dermatology  - psoriasis possible and may need more potent steroid. Refer to derm.   Mixed hyperlipidemia - Plan: Comprehensive metabolic panel, Lipid panel  -Repeat labs.  Hypertriglyceridemia and pancreatitis risk discussed.  Asymptomatic at present.  Consider statin if still elevated.  Severe obesity (BMI >= 40) (HCC) Screening for diabetes mellitus - Plan: Hemoglobin A1c   -Goal of BMI initially was 45.  Cut back on fast food, sugar-containing beverages, recheck 6 months  History of gout - Plan: Uric Acid  -Asymptomatic recently, check uric acid, then if recurrent flare would consider allopurinol.   No orders of the defined types were placed in this encounter.  Patient Instructions   For skin rash - I will refer you to dermatology.  For now - can try steroid cream twice per day for itching if needed, eucerin for dry skin twice per day.   If another gout flare, or more than a few per year I would consider daily med - especially if uric acid still elevated.   Try to decrease sweet tea or mix  with unsweetened tea, minimize fast food as much as possible.  I will check cholesterol levels today, but may need to take medication if triglycerides are as high as they were when last tested.  Please follow-up in 6 months to review lab work again but let me know if there are questions sooner.    Keeping you healthy  Get these tests  Blood pressure- Have your blood pressure checked once a year by your healthcare provider.  Normal blood pressure is 120/80.  Weight- Have your body mass index (BMI) calculated to screen for obesity.  BMI is a measure of body fat based on height and weight. You can also calculate your own BMI at https://www.west-esparza.com/www.nhlbisupport.com/bmi/.  Cholesterol- Have your cholesterol checked regularly starting at age 34, sooner may be necessary if you have diabetes, high blood pressure, if a family member developed heart diseases at an early age or if you smoke.   Chlamydia, HIV, and other sexual transmitted disease- Get screened each year until the age of 34 then within three months of each new sexual partner.  Diabetes- Have your blood sugar checked regularly if you have high blood pressure, high cholesterol, a family history of diabetes or if you are overweight.  Get these vaccines  Flu shot- Every fall.  Tetanus shot- Every 10 years.  Menactra- Single dose; prevents meningitis.  Take these steps  Don't smoke- If you do smoke, ask your healthcare provider about quitting. For tips on how to quit, go to www.smokefree.gov  or call 1-800-QUIT-NOW.  Be physically active- Exercise 5 days a week for at least 30 minutes.  If you are not already physically active start slow and gradually work up to 30 minutes of moderate physical activity.  Examples of moderate activity include walking briskly, mowing the yard, dancing, swimming bicycling, etc.  Eat a healthy diet- Eat a variety of healthy foods such as fruits, vegetables, low fat milk, low fat cheese, yogurt, lean meats, poultry, fish,  beans, tofu, etc.  For more information on healthy eating, go to www.thenutritionsource.org  Drink alcohol in moderation- Limit alcohol intake two drinks or less a day.  Never drink and drive.  Dentist- Brush and floss teeth twice daily; visit your dentis twice a year.  Depression-Your emotional health is as important as your physical health.  If you're feeling down, losing interest in things you normally enjoy please talk with your healthcare provider.  Gun Safety- If you keep a gun in your home, keep it unloaded and with the safety lock on.  Bullets should be stored separately.  Helmet use- Always wear a helmet when riding a motorcycle, bicycle, rollerblading or skateboarding.  Safe sex- If you may be exposed to a sexually transmitted infection, use a condom  Seat belts- Seat bels can save your life; always wear one.  Smoke/Carbon Monoxide detectors- These detectors need to be installed on the appropriate level of your home.  Replace batteries at least once a year.  Skin Cancer- When out in the sun, cover up and use sunscreen SPF 15 or higher.  Violence- If anyone is threatening or hurting you, please tell your healthcare provider.   If you have lab work done today you will be contacted with your lab results within the next 2 weeks.  If you have not heard from [place] then please contact us. The fastest way to get your results is to register for My Chart.   IF you received an x-ray today, you will receive an invoice from [facility]. Please contact [facility] at [phone] with questions or concerns regarding your invoice.   IF you received labwork today, you will receive an invoice from [place]. Please contact [facility] at [phone] with questions or concerns regarding your invoice.   Our billing staff will not be able to assist you with questions regarding bills from these companies.  You will be contacted with the lab results as soon as they are available. The  fastest way to get your results is to activate your My Chart account. Instructions are located on the last page of this paperwork. If you have not heard from [place] regarding the results in 2 weeks, please contact this office.          Signed, Meredith Staggers, MD [facility]

## 2019-09-15 NOTE — Patient Instructions (Addendum)
For skin rash - I will refer you to dermatology.  For now - can try steroid cream twice per day for itching if needed, eucerin for dry skin twice per day.   If another gout flare, or more than a few per year I would consider daily med - especially if uric acid still elevated.   Try to decrease sweet tea or mix with unsweetened tea, minimize fast food as much as possible.  I will check cholesterol levels today, but may need to take medication if triglycerides are as high as they were when last tested.  Please follow-up in 6 months to review lab work again but let me know if there are questions sooner.    Keeping you healthy  Get these tests  Blood pressure- Have your blood pressure checked once a year by your healthcare provider.  Normal blood pressure is 120/80.  Weight- Have your body mass index (BMI) calculated to screen for obesity.  BMI is a measure of body fat based on height and weight. You can also calculate your own BMI at https://www.west-esparza.com/.  Cholesterol- Have your cholesterol checked regularly starting at age 75, sooner may be necessary if you have diabetes, high blood pressure, if a family member developed heart diseases at an early age or if you smoke.   Chlamydia, HIV, and other sexual transmitted disease- Get screened each year until the age of 55 then within three months of each new sexual partner.  Diabetes- Have your blood sugar checked regularly if you have high blood pressure, high cholesterol, a family history of diabetes or if you are overweight.  Get these vaccines  Flu shot- Every fall.  Tetanus shot- Every 10 years.  Menactra- Single dose; prevents meningitis.  Take these steps  Don't smoke- If you do smoke, ask your healthcare provider about quitting. For tips on how to quit, go to www.smokefree.gov or call 1-800-QUIT-NOW.  Be physically active- Exercise 5 days a week for at least 30 minutes.  If you are not already physically active start slow and  gradually work up to 30 minutes of moderate physical activity.  Examples of moderate activity include walking briskly, mowing the yard, dancing, swimming bicycling, etc.  Eat a healthy diet- Eat a variety of healthy foods such as fruits, vegetables, low fat milk, low fat cheese, yogurt, lean meats, poultry, fish, beans, tofu, etc.  For more information on healthy eating, go to www.thenutritionsource.org  Drink alcohol in moderation- Limit alcohol intake two drinks or less a day.  Never drink and drive.  Dentist- Brush and floss teeth twice daily; visit your dentis twice a year.  Depression-Your emotional health is as important as your physical health.  If you're feeling down, losing interest in things you normally enjoy please talk with your healthcare provider.  Gun Safety- If you keep a gun in your home, keep it unloaded and with the safety lock on.  Bullets should be stored separately.  Helmet use- Always wear a helmet when riding a motorcycle, bicycle, rollerblading or skateboarding.  Safe sex- If you may be exposed to a sexually transmitted infection, use a condom  Seat belts- Seat bels can save your life; always wear one.  Smoke/Carbon Monoxide detectors- These detectors need to be installed on the appropriate level of your home.  Replace batteries at least once a year.  Skin Cancer- When out in the sun, cover up and use sunscreen SPF 15 or higher.  Violence- If anyone is threatening or hurting you, please tell  your healthcare provider.   If you have lab work done today you will be contacted with your lab results within the next 2 weeks.  If you have not heard from [place] then please contact us. The fastest way to get your results is to register for My Chart.   IF you received an x-ray today, you will receive an invoice from [facility]. Please contact [facility] at [phone] with questions or concerns regarding your invoice.   IF you received labwork today, you  will receive an invoice from [place]. Please contact [facility] at [phone] with questions or concerns regarding your invoice.   Our billing staff will not be able to assist you with questions regarding bills from these companies.  You will be contacted with the lab results as soon as they are available. The fastest way to get your results is to activate your My Chart account. Instructions are located on the last page of this paperwork. If you have not heard from [place] regarding the results in 2 weeks, please contact this office.

## 2019-09-16 LAB — COMPREHENSIVE METABOLIC PANEL
ALT: 32 IU/L (ref 0–44)
AST: 25 IU/L (ref 0–40)
Albumin/Globulin Ratio: 1.6 (ref 1.2–2.2)
Albumin: 4.4 g/dL (ref 4.0–5.0)
Alkaline Phosphatase: 80 IU/L (ref 39–117)
BUN/Creatinine Ratio: 14 (ref 9–20)
BUN: 15 mg/dL (ref 6–20)
Bilirubin Total: 0.5 mg/dL (ref 0.0–1.2)
CO2: 21 mmol/L (ref 20–29)
Calcium: 9.7 mg/dL (ref 8.7–10.2)
Chloride: 102 mmol/L (ref 96–106)
Creatinine, Ser: 1.11 mg/dL (ref 0.76–1.27)
GFR calc Af Amer: 100 mL/min/{1.73_m2} (ref >59)
GFR calc non Af Amer: 86 mL/min/{1.73_m2} (ref >59)
Globulin, Total: 2.8 g/dL (ref 1.5–4.5)
Glucose: 84 mg/dL (ref 65–99)
Potassium: 4.5 mmol/L (ref 3.5–5.2)
Sodium: 138 mmol/L (ref 134–144)
Total Protein: 7.2 g/dL (ref 6.0–8.5)

## 2019-09-16 LAB — HEMOGLOBIN A1C
Est. average glucose Bld gHb Est-mCnc: 103 mg/dL
Hgb A1c MFr Bld: 5.2 % (ref 4.8–5.6)

## 2019-09-16 LAB — LIPID PANEL
Chol/HDL Ratio: 7.5 ratio — ABNORMAL HIGH (ref 0.0–5.0)
Cholesterol, Total: 226 mg/dL — ABNORMAL HIGH (ref 100–199)
HDL: 30 mg/dL — ABNORMAL LOW (ref >39)
LDL Chol Calc (NIH): 121 mg/dL — ABNORMAL HIGH (ref 0–99)
Triglycerides: 420 mg/dL — ABNORMAL HIGH (ref 0–149)
VLDL Cholesterol Cal: 75 mg/dL — ABNORMAL HIGH (ref 5–40)

## 2019-09-16 LAB — URIC ACID: Uric Acid: 8 mg/dL (ref 3.7–8.6)

## 2019-09-19 ENCOUNTER — Other Ambulatory Visit: Payer: Self-pay | Admitting: Family Medicine

## 2019-09-19 MED ORDER — ATORVASTATIN CALCIUM 10 MG PO TABS: 10.0000 mg | ORAL_TABLET | Freq: Every day | ORAL | 0 refills | Status: AC

## 2019-09-19 NOTE — Progress Notes (Signed)
See labs 

## 2019-11-02 ENCOUNTER — Encounter: Payer: 59 | Admitting: Family Medicine

## 2020-09-16 DIAGNOSIS — E669 Obesity, unspecified: Secondary | ICD-10-CM | POA: Insufficient documentation

## 2020-09-16 DIAGNOSIS — E782 Mixed hyperlipidemia: Secondary | ICD-10-CM | POA: Insufficient documentation

## 2022-11-11 DIAGNOSIS — J069 Acute upper respiratory infection, unspecified: Secondary | ICD-10-CM | POA: Insufficient documentation

## 2023-08-23 DIAGNOSIS — J302 Other seasonal allergic rhinitis: Secondary | ICD-10-CM | POA: Insufficient documentation

## 2023-12-04 ENCOUNTER — Ambulatory Visit
Admission: EM | Admit: 2023-12-04 | Discharge: 2023-12-04 | Disposition: A | Discharge status: Home / Self Care | Payer: PRIVATE HEALTH INSURANCE

## 2023-12-04 DIAGNOSIS — H1032 Unspecified acute conjunctivitis, left eye: Secondary | ICD-10-CM | POA: Diagnosis not present

## 2023-12-04 DIAGNOSIS — Z882 Allergy status to sulfonamides status: Secondary | ICD-10-CM

## 2023-12-04 DIAGNOSIS — Z880 Allergy status to penicillin: Secondary | ICD-10-CM

## 2023-12-04 HISTORY — DX: Allergy status to penicillin: Z88.0

## 2023-12-04 HISTORY — DX: Allergy status to sulfonamides: Z88.2

## 2023-12-04 MED ORDER — POLYMYXIN B-TRIMETHOPRIM 10000-0.1 UNIT/ML-% OP SOLN: 1.0000 [drp] | OPHTHALMIC | 0 refills | Status: AC

## 2023-12-04 NOTE — ED Triage Notes (Signed)
"  My left eye started bothering me yesterday with watering, draining, felt as if something is in it". "I do have sinus problems now too, runny nose". "OTC allergy eye drops have helped a little". No pain in eye "just around eye". No fever.

## 2023-12-04 NOTE — ED Provider Notes (Signed)
[facility]    CSN: [id number] Arrival date & time: 12/04/23  7829      History   Chief Complaint Chief Complaint  Patient presents with  . Eye Problem    HPI Scott Payne is a 38 y.o. male.    Eye Problem   Past Medical History:  Diagnosis Date  . Allergy   . Allergy to penicillin   . Allergy to sulfa drugs   . Gout   . Hyperlipidemia     Patient Active Problem List   Diagnosis Date Noted  . Allergy to penicillin   . Allergy to sulfa drugs   . Seasonal allergic rhinitis 08/23/2023  . Acute upper respiratory infection 11/11/2022  . Mixed hyperlipidemia 09/16/2020  . Obesity, unspecified 09/16/2020  . Cough 07/01/2018  . Lower resp. tract infection 07/01/2018  . Obesity 09/14/2016  . Hyperlipidemia 09/14/2016  . Hyperuricemia 09/19/2015    Past Surgical History:  Procedure Laterality Date  . COSMETIC SURGERY    . KNEE SURGERY Right    2007       Home Medications    Prior to Admission medications   Medication Sig Start Date End Date Taking? Authorizing Provider  guaiFENesin (MUCINEX) 600 MG 12 hr tablet Take by mouth 2 (two) times daily.   Yes [provider]  icosapent Ethyl (VASCEPA) 1 g capsule Take 2 g by mouth 2 (two) times daily. 09/18/21  Yes [provider]  methylPREDNISolone (MEDROL DOSEPAK) 4 MG TBPK tablet Take 4 mg by mouth as directed. 08/23/23  Yes [provider]  Phenylephrine-Pheniramine-DM [facility] COLD & COUGH PO) Take by mouth.   Yes [provider]  promethazine (PHENERGAN) 6.25 MG/5ML solution Take 5 mLs by mouth every 4 (four) hours as needed. 08/23/23  Yes [provider]  UNKNOWN TO PATIENT Allergy eye drops.   Yes [provider]  atorvastatin (LIPITOR) 10 MG tablet Take 1 tablet (10 mg total) by mouth daily. 09/19/19   Shade Flood, MD  azithromycin (ZITHROMAX) 250 MG tablet Take 250 mg by mouth daily.    [provider]  chlorhexidine  (PERIDEX) 0.12 % solution SWISH FOR 1 MINUTE AND EXPECTORATE TWICE A DAY FOR 10 DAYS    [provider]  fenofibrate 160 MG tablet Take 1 tablet by mouth daily.    [provider]  fenofibrate 54 MG tablet Take 54 mg by mouth daily.    [provider]  ibuprofen (ADVIL) 800 MG tablet Take 1 tablet by mouth every 8 (eight) hours as needed.    [provider]  loratadine (CLARITIN) 10 MG tablet Take 10 mg by mouth daily.    [provider]  mupirocin ointment (BACTROBAN) 2 % Apply 1 Application topically 2 (two) times daily.    [provider]  promethazine-dextromethorphan (PROMETHAZINE-DM) 6.25-15 MG/5ML syrup Take 5 mLs by mouth every 4 (four) hours.    [provider]  triamcinolone cream (KENALOG) 0.1 % Apply 1 application topically 2 (two) times daily. 09/15/19   Shade Flood, MD    Family History Family History  Problem Relation Age of Onset  . Diabetes Mother   . Heart disease Mother   . Hyperlipidemia Mother   . Hypertension Mother   . Cancer Father   . Diabetes Sister     Social History Social History   Tobacco Use  . Smoking status: Never    Passive exposure: Never  . Smokeless tobacco: Never  Vaping Use  .  Vaping status: Never Used  Substance Use Topics  . Alcohol use: No  . Drug use: No     Allergies   Sulfa antibiotics and Penicillins   Review of Systems Review of Systems   Physical Exam Triage Vital Signs ED Triage Vitals  Encounter Vitals Group     BP 12/04/23 1031 123/80     Systolic BP Percentile --      Diastolic BP Percentile --      Pulse Rate 12/04/23 1031 83     Resp 12/04/23 1031 18     Temp 12/04/23 1031 98 F (36.7 C)     Temp Source 12/04/23 1031 Oral     SpO2 12/04/23 1031 97 %     Weight 12/04/23 1029 270 lb (122.5 kg)     Height 12/04/23 1029 5\' 9"  (1.753 m)     Head Circumference --      Peak Flow --      Pain Score 12/04/23 1026 2     Pain Loc --      Pain  Education --      Exclude from Growth Chart --    No data found.  Updated Vital Signs BP 123/80 (BP Location: Left Arm)   Pulse 83   Temp 98 F (36.7 C) (Oral)   Resp 18   Ht 5\' 9"  (1.753 m)   Wt 270 lb (122.5 kg)   SpO2 97%   BMI 39.87 kg/m   Visual Acuity Right Eye Distance: 20/20 (Uncorrected) Left Eye Distance: 20/25 (Uncorrected) Bilateral Distance: 20/20 (Uncorrected, Color Test (General, Pass))  Right Eye Near:   Left Eye Near:    Bilateral Near:     Physical Exam   UC Treatments / Results  Labs (all labs ordered are listed, but only abnormal results are displayed) Labs Reviewed - No data to display  EKG   Radiology No results found.  Procedures Procedures (including critical care time)  Medications Ordered in UC Medications - No data to display  Initial Impression / Assessment and Plan / UC Course  I have reviewed the triage vital signs and the nursing notes.  Pertinent labs & imaging results that were available during my care of the patient were reviewed by me and considered in my medical decision making (see chart for details).     *** Final Clinical Impressions(s) / UC Diagnoses   Final diagnoses:  None   Discharge Instructions   None    ED Prescriptions   None    PDMP not reviewed this encounter.
